# Patient Record
Sex: Female | Born: 1980 | Race: Black or African American | Hispanic: No | Marital: Married | Smoking: Never smoker
Health system: Southern US, Community
[De-identification: ages and names within clinical notes are randomized; demographics above are authoritative.]

## PROBLEM LIST (undated history)

## (undated) ENCOUNTER — Inpatient Hospital Stay (HOSPITAL_COMMUNITY): Payer: Self-pay

## (undated) DIAGNOSIS — O24419 Gestational diabetes mellitus in pregnancy, unspecified control: Secondary | ICD-10-CM

## (undated) DIAGNOSIS — E063 Autoimmune thyroiditis: Secondary | ICD-10-CM

## (undated) DIAGNOSIS — E039 Hypothyroidism, unspecified: Secondary | ICD-10-CM

## (undated) HISTORY — PX: UMBILICAL HERNIA REPAIR: SHX196

## (undated) HISTORY — PX: HERNIA REPAIR: SHX51

## (undated) HISTORY — DX: Gestational diabetes mellitus in pregnancy, unspecified control: O24.419

---

## 2003-10-28 ENCOUNTER — Inpatient Hospital Stay (HOSPITAL_COMMUNITY): Admission: AD | Admit: 2003-10-28 | Discharge: 2003-10-28 | Payer: Self-pay | Admitting: Obstetrics and Gynecology

## 2003-10-30 ENCOUNTER — Inpatient Hospital Stay (HOSPITAL_COMMUNITY): Admission: AD | Admit: 2003-10-30 | Discharge: 2003-10-30 | Payer: Self-pay | Admitting: Obstetrics and Gynecology

## 2003-10-30 ENCOUNTER — Other Ambulatory Visit: Admission: RE | Admit: 2003-10-30 | Discharge: 2003-10-30 | Payer: Self-pay | Admitting: Obstetrics and Gynecology

## 2004-09-19 ENCOUNTER — Other Ambulatory Visit: Admission: RE | Admit: 2004-09-19 | Discharge: 2004-09-19 | Payer: Self-pay | Admitting: Obstetrics & Gynecology

## 2005-09-23 ENCOUNTER — Other Ambulatory Visit: Admission: RE | Admit: 2005-09-23 | Discharge: 2005-09-23 | Payer: Self-pay | Admitting: Obstetrics & Gynecology

## 2006-08-04 ENCOUNTER — Emergency Department (HOSPITAL_COMMUNITY): Admission: EM | Admit: 2006-08-04 | Discharge: 2006-08-05 | Payer: Self-pay | Admitting: Emergency Medicine

## 2006-08-04 ENCOUNTER — Emergency Department (HOSPITAL_COMMUNITY): Admission: EM | Admit: 2006-08-04 | Discharge: 2006-08-04 | Payer: Self-pay | Admitting: Emergency Medicine

## 2006-10-12 ENCOUNTER — Inpatient Hospital Stay (HOSPITAL_COMMUNITY): Admission: AD | Admit: 2006-10-12 | Discharge: 2006-10-12 | Payer: Self-pay | Admitting: Obstetrics and Gynecology

## 2006-10-14 ENCOUNTER — Inpatient Hospital Stay (HOSPITAL_COMMUNITY): Admission: AD | Admit: 2006-10-14 | Discharge: 2006-10-14 | Payer: Self-pay | Admitting: Obstetrics and Gynecology

## 2006-10-16 ENCOUNTER — Inpatient Hospital Stay (HOSPITAL_COMMUNITY): Admission: AD | Admit: 2006-10-16 | Discharge: 2006-10-17 | Payer: Self-pay | Admitting: Obstetrics and Gynecology

## 2006-10-23 ENCOUNTER — Inpatient Hospital Stay (HOSPITAL_COMMUNITY): Admission: AD | Admit: 2006-10-23 | Discharge: 2006-10-23 | Payer: Self-pay | Admitting: Obstetrics and Gynecology

## 2006-10-28 ENCOUNTER — Inpatient Hospital Stay (HOSPITAL_COMMUNITY): Admission: AD | Admit: 2006-10-28 | Discharge: 2006-10-28 | Payer: Self-pay | Admitting: Obstetrics and Gynecology

## 2006-11-02 ENCOUNTER — Inpatient Hospital Stay (HOSPITAL_COMMUNITY): Admission: AD | Admit: 2006-11-02 | Discharge: 2006-11-02 | Payer: Self-pay | Admitting: Obstetrics and Gynecology

## 2006-11-06 ENCOUNTER — Inpatient Hospital Stay (HOSPITAL_COMMUNITY): Admission: AD | Admit: 2006-11-06 | Discharge: 2006-11-06 | Payer: Self-pay | Admitting: Obstetrics and Gynecology

## 2007-05-03 ENCOUNTER — Inpatient Hospital Stay (HOSPITAL_COMMUNITY): Admission: AD | Admit: 2007-05-03 | Discharge: 2007-05-04 | Payer: Self-pay | Admitting: Obstetrics and Gynecology

## 2007-06-07 ENCOUNTER — Inpatient Hospital Stay (HOSPITAL_COMMUNITY): Admission: AD | Admit: 2007-06-07 | Discharge: 2007-06-07 | Payer: Self-pay | Admitting: Obstetrics and Gynecology

## 2007-06-14 ENCOUNTER — Inpatient Hospital Stay (HOSPITAL_COMMUNITY): Admission: AD | Admit: 2007-06-14 | Discharge: 2007-06-17 | Payer: Self-pay | Admitting: Obstetrics and Gynecology

## 2007-06-14 ENCOUNTER — Encounter (INDEPENDENT_AMBULATORY_CARE_PROVIDER_SITE_OTHER): Payer: Self-pay | Admitting: Obstetrics & Gynecology

## 2007-06-22 ENCOUNTER — Inpatient Hospital Stay (HOSPITAL_COMMUNITY): Admission: AD | Admit: 2007-06-22 | Discharge: 2007-06-22 | Payer: Self-pay | Admitting: Obstetrics & Gynecology

## 2008-09-23 IMAGING — CR DG CHEST 2V
2 series · 2 of 2 positions shown · non-contrast
Comparison: none

CLINICAL DATA: Cough, nausea, vomiting. 
 CHEST - 2 VIEW:
 The heart size and mediastinal contours are within normal limits.  Both lungs are clear.  The visualized skeletal structures are unremarkable.

[w chest pa]
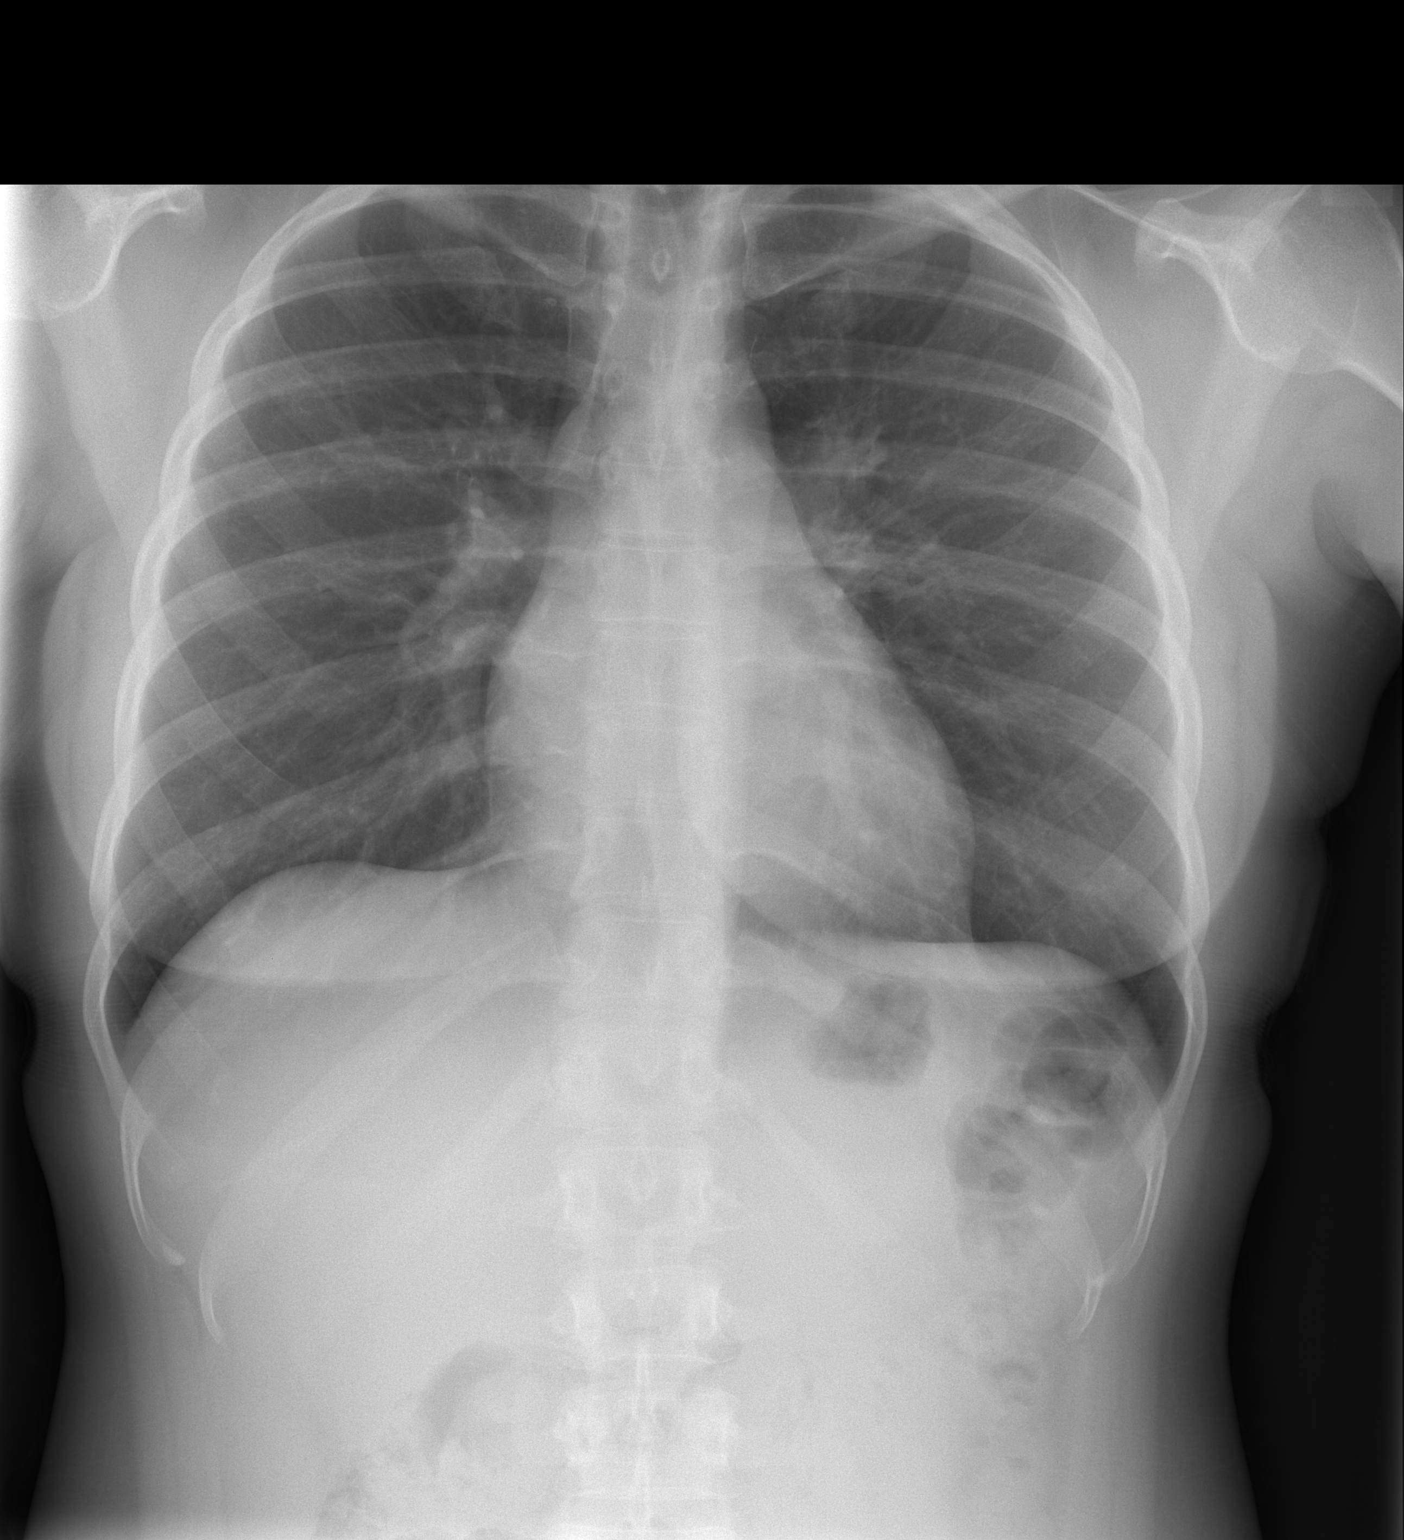

[w chest lat]
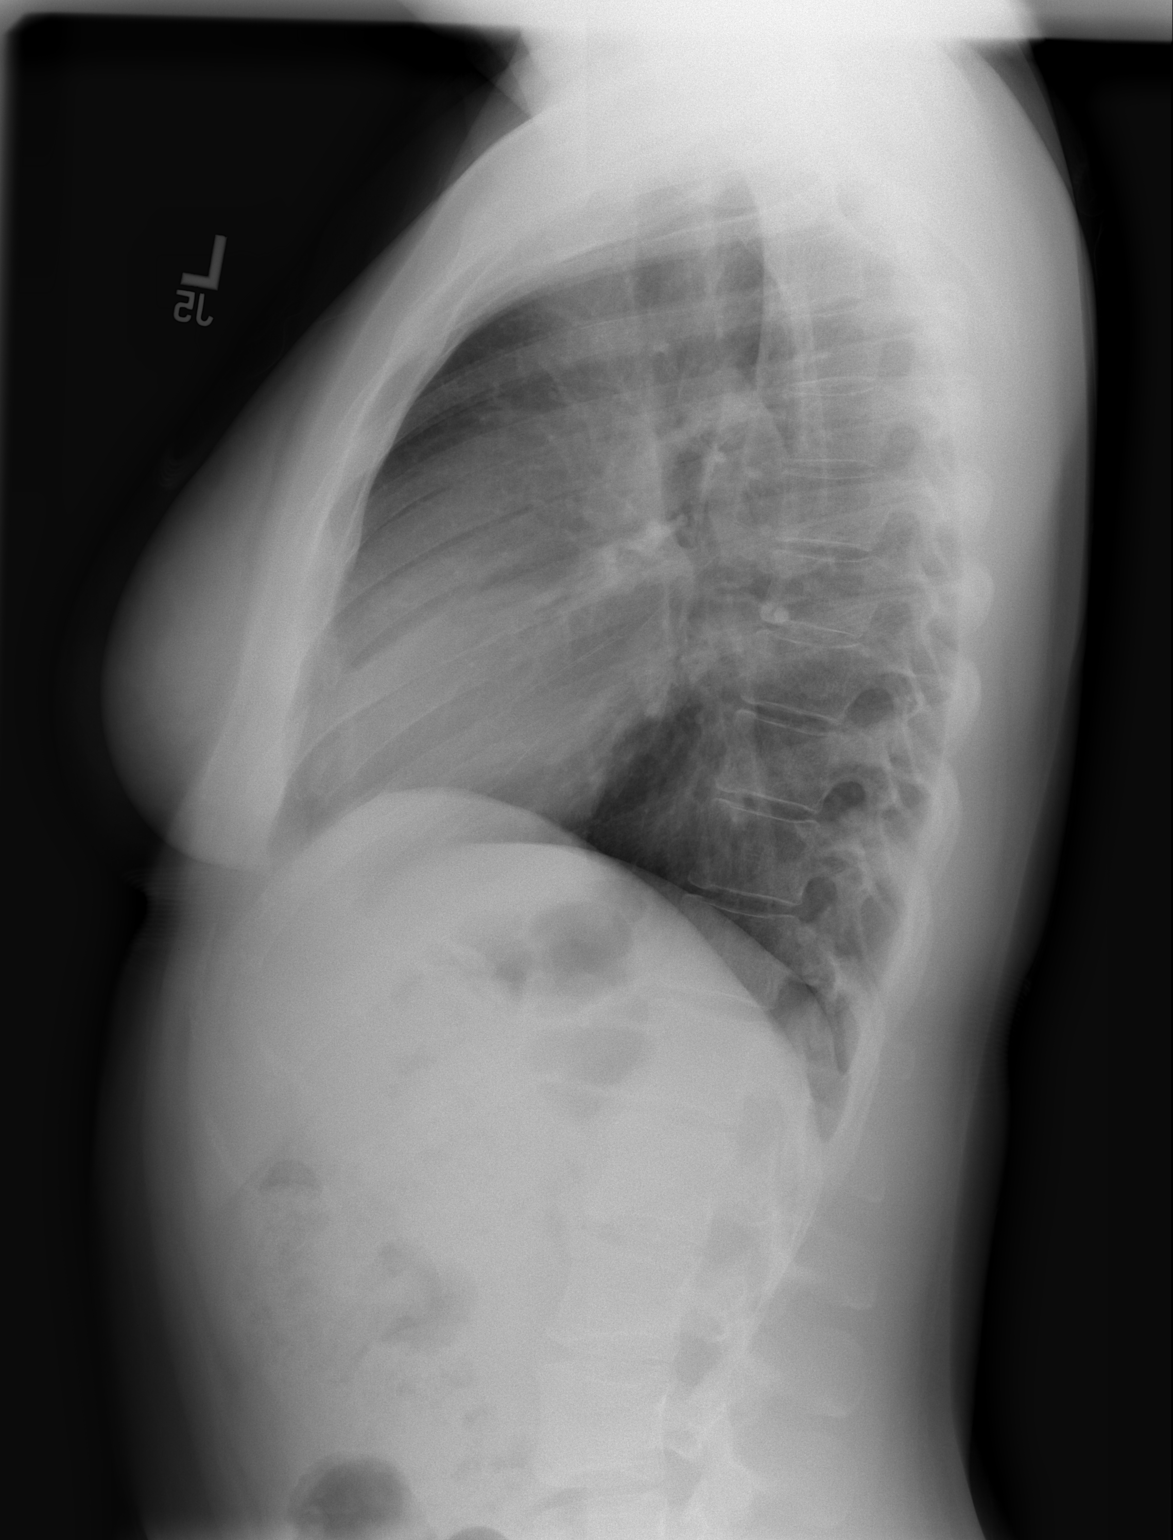

[2 of 2 positions shown; findings below may reference images not displayed]

IMPRESSION: No active lung disease.

## 2009-11-05 ENCOUNTER — Emergency Department (HOSPITAL_BASED_OUTPATIENT_CLINIC_OR_DEPARTMENT_OTHER): Admission: EM | Admit: 2009-11-05 | Discharge: 2009-11-05 | Payer: Self-pay | Admitting: Emergency Medicine

## 2010-01-27 ENCOUNTER — Encounter: Admission: RE | Admit: 2010-01-27 | Discharge: 2010-01-27 | Payer: Self-pay | Admitting: Family Medicine

## 2011-02-03 NOTE — Op Note (Signed)
NAMERAELEIGH, GUINN NO.:  [id number]   MEDICAL RECORD NO.:  [id number]          PATIENT TYPE:  INP   LOCATION:  9117                          FACILITY:  [facility]   PHYSICIAN:  Gerrit Friends. Aldona Bar, M.D.   DATE OF BIRTH:  07/08/1981   DATE OF PROCEDURE:  06/14/2007  DATE OF DISCHARGE:                               OPERATIVE REPORT   PREOPERATIVE DIAGNOSIS:  Term pregnancy, early labor, nonreassuring  fetal heart tracing, meconium stained amniotic fluid.   POSTOPERATIVE DIAGNOSIS:  Term pregnancy, early labor, nonreassuring  fetal heart tracing, meconium stained amniotic fluid, delivery of 7  pounds 10 ounces female infant, Apgars 1 and 9, arterial cord pH 7.34 and  thick particulate meconium.   PROCEDURE:  Primary low transverse cesarean section.   SURGEON:  Gerrit Friends. Aldona Bar, M.D.   ANESTHESIA:  Spinal.   HISTORY:  This 30 year old gravida 4, para 0 presented to maternity  admissions at approximately 5:30 a.m. on the morning of June 14, 2007 and shortly after being placed on the fetal monitor had a  deceleration of the fetal heart.  Subsequently biophysical profile was  done with findings of 6/8.  When I arrived, checked the patient at  approximately 7:45, having been called because of a second significant  deceleration, there was meconium stained amniotic fluid noted on the bed  consistent with recent rupture of membranes.  Evaluation of the patient  found her 1 cm dilated, cervix fairly posterior, and although the fetal  heart rate tracing at this time was reassuring, there had been  approximately 10 minutes earlier a deceleration into the 80s lasting  approximately 3-4 minutes.  Because of the two decelerations and the  early phase of labor associated with meconium stained amniotic fluid,  discussion was carried out with the patient and partner as to questions  about fetal well-being and therefore decision was made after my  recommendation to proceed with a  primary low transverse cesarean section  for delivery.   The patient was readied and transferred to the operating room for  cesarean section.   Once in the operating room, spinal anesthetic was carried out by Dr.  Arby Barrette.  After the patient was appropriate position and good fetal  heart tones were noted and the patient was thereafter prepped and draped  with a Foley catheter inserted as part of the prep.   At this time once good anesthetic levels were documented, a Pfannenstiel  incision was made with minimal difficulty dissected down sharply to and  through the fascia in a low transverse fashion with hemostasis created  at each layer.  Subfascial space was created inferiorly and superiorly,  muscles separated in midline with care taken to avoid the bowel  superiorly and the bladder inferiorly.  At this time with the peritoneum  opened, the vesicouterine peritoneum was incised in a low transverse  fashion and pushed off the lower uterine segment with ease.  Sharp  incision into the uterus in low transverse fashion was then carried out  with Metzenbaum scissors and extended laterally with fingers.  Particulate fresh green meconium stained amniotic fluid was noted.  Thereafter delivery of viable female infant was carried out from vertex  position and the infant was suctioned as best as possible prior to  delivering the remainder of the infant, clamping the cord, and passed in  the infant off to the awaiting team.   Cord arterial cord pH was collected and returned at 7.34.   Subsequent Apgars were noted to be 1 and 9 and the infant was also taken  to the regular nursery in good condition.   After the cord bloods were collected, placenta was delivered intact.  Membranes for meconium stained and placenta was sent to pathology  appropriately labeled.   At this time the uterus was exteriorized, rendered free of any remaining  products of conception and good uterine contractility was  afforded with  slowly given intravenous Pitocin and manual stimulation.  Closure of the  uterine incision was carried out with a single layer of #1 Vicryl in  running locking fashion with excellent hemostasis.  Tubes and ovaries  were noted to be normal.  After the abdomen was lavaged of all blood and  clot, the uterus was replaced into the abdominal cavity and after all  counts were noted be correct and no foreign bodies noted to be remaining  abdominal cavity, closure of the abdomen was begun in layers.  The  abdominal peritoneum was closed with zero Vicryl in running fashion.  Muscle secured with same.  Assured of good fascial hemostasis, fascia  was then reapproximated using 0 Vicryl from angle to midline  bilaterally.  Subcutaneous tissues were rendered hemostatic and staples  then used to close the skin.  A sterile pressure dressing was applied.  At this time the patient was transported to recovery room in  satisfactory condition having tolerated procedure well.  Estimated blood  loss was 500 mL.  All counts correct x2.   In summary this patient presented to maternity admissions with  nonreassuring fetal heart tracing and meconium stained amniotic fluid  and was in early labor at term.  Because of questions about fetal well-  being, decision was made to proceed with a primary low transverse  cesarean section which was carried out without difficulty.  The infant,  although the Apgar hours were 1 and 9 was ultimately taken to the  regular nursery in good condition.  The arterial cord pH was 7.34.  The  Apgar of 1 was probably explained by suctioning of the infant to remove  meconium.   At the conclusion of procedure, both mother and baby were doing well in  their respective recovery areas.  All counts correct.  Estimated loss  500 mL.      Gerrit Friends. Aldona Bar, M.D.  Electronically Signed     RMW/MEDQ  D:  06/14/2007  T:  06/14/2007  Job:  [id number]

## 2011-02-06 NOTE — Discharge Summary (Signed)
Brenda Welch, HIPP NO.:  [id number]   MEDICAL RECORD NO.:  [id number]          PATIENT TYPE:  INP   LOCATION:  9117                          FACILITY:  [facility]   PHYSICIAN:  Ilda Mori, M.D.   DATE OF BIRTH:  08/28/1981   DATE OF ADMISSION:  06/14/2007  DATE OF DISCHARGE:  06/17/2007                               DISCHARGE SUMMARY   FINAL DIAGNOSES:  1. Intrauterine pregnancy at term.  2. Early active labor.  3. Non-reassuring fetal heart tracing.  4. Meconium-stained amniotic fluid with thick articular meconium noted      with delivery.   PROCEDURE:  Primary low transverse cesarean section.   SURGEON:  Dr. Annamaria Helling.   COMPLICATIONS:  None.   This 30 year old G4, P0-0-3-0, presents at 4 weeks' gestation to the  [facility]  She was in early labor.  She was placed on the fetal  monitor and immediately a deceleration of the fetal heart was noted.  The patient had a BPP performed which was 6/8.  Dr. Aldona Bar at this point  arrived to check the patient by 7:45, and was called because of this  thick and significant deceleration.  Meconium-stained amniotic fluid was  noted with recent rupture of membranes.  The patient was only about a  centimeter dilated and still very posterior.  The fetal heart rate  tracing at this time was reassuring, even though there had been a  deceleration down to the 80s prior. Because of the 2 decelerations in  the early phase of labor and meconium-stained amniotic fluid, a decision  was held with the patient and her partner, and a decision was made to  proceed with a cesarean section secondary to these non-reassuring fetal  heart tracings.  The patient was taken to the operating room on  June 14, 2007 by Dr. Annamaria Helling, where a primary low transverse  cesarean section was performed with the delivery of a 7-pound 10-ounce  female infant with Apgar's of 1 and 9.  An arterial cord pH was obtained  of 7.34, and thick  particulate meconium matter was noted with delivery.  Ultimately, the baby was taken to the regular nursery in good condition.  The procedure went without complications.  The patient's postoperative  course was benign without any significant fevers.  She did want her  little boy circumcised before discharge, which was performed, and was  felt ready for discharge on postoperative day #3.  Was sent home on a  regular diet.  Told to decrease activities.  Told to continue her  prenatal vitamins.  Was given Tylox #15, 1-2 every 4 hours as needed for  pain.  Told she could use  ibuprofen up to 600 mg every 6 hours as  needed for pain, and was to follow up in our office in 4 weeks.  Instructions and precautions were reviewed with the patient.   LABORATORY DATA ON DISCHARGE:  She had a hemoglobin of 9.4, white blood  cell count of 11.9 and platelets of 159,000.      Leilani Able, P.A.-C.      Ilda Mori, M.D.  Electronically Signed  MB/MEDQ  D:  07/22/2007  T:  07/23/2007  Job:  [id number]

## 2011-06-30 ENCOUNTER — Other Ambulatory Visit: Payer: Self-pay | Admitting: Obstetrics & Gynecology

## 2011-07-02 LAB — CBC
Hemoglobin: 11.5 — ABNORMAL LOW
MCHC: 34
MCHC: 34.3
MCV: 88.3
Platelets: 210
RBC: 3.1 — ABNORMAL LOW
RBC: 3.81 — ABNORMAL LOW
RDW: 14.7 — ABNORMAL HIGH
WBC: 11 — ABNORMAL HIGH

## 2011-07-06 LAB — URINALYSIS, ROUTINE W REFLEX MICROSCOPIC
Bilirubin Urine: NEGATIVE
Glucose, UA: >1000 — AB
Hgb urine dipstick: NEGATIVE
Nitrite: NEGATIVE
Protein, ur: NEGATIVE
pH: 6

## 2011-07-06 LAB — URINE MICROSCOPIC-ADD ON: WBC, UA: NONE SEEN

## 2013-02-18 ENCOUNTER — Encounter (HOSPITAL_COMMUNITY): Payer: Self-pay | Admitting: *Deleted

## 2013-02-18 ENCOUNTER — Inpatient Hospital Stay (HOSPITAL_COMMUNITY)
Admission: AD | Admit: 2013-02-18 | Discharge: 2013-02-18 | Disposition: A | Discharge status: Home / Self Care | Payer: 59 | Source: Ambulatory Visit | Attending: Obstetrics and Gynecology | Admitting: Obstetrics and Gynecology

## 2013-02-18 DIAGNOSIS — E860 Dehydration: Secondary | ICD-10-CM | POA: Insufficient documentation

## 2013-02-18 DIAGNOSIS — O219 Vomiting of pregnancy, unspecified: Secondary | ICD-10-CM

## 2013-02-18 DIAGNOSIS — O210 Mild hyperemesis gravidarum: Secondary | ICD-10-CM

## 2013-02-18 DIAGNOSIS — O211 Hyperemesis gravidarum with metabolic disturbance: Secondary | ICD-10-CM | POA: Insufficient documentation

## 2013-02-18 LAB — URINALYSIS, ROUTINE W REFLEX MICROSCOPIC
Hgb urine dipstick: NEGATIVE
Ketones, ur: 15 mg/dL — AB
Protein, ur: NEGATIVE mg/dL
Urobilinogen, UA: 0.2 mg/dL (ref 0.0–1.0)
pH: 6 (ref 5.0–8.0)

## 2013-02-18 MED ORDER — ONDANSETRON 4 MG PO TBDP: 4.0000 mg | ORAL_TABLET | Freq: Four times a day (QID) | ORAL | Status: DC | PRN

## 2013-02-18 MED ORDER — LACTATED RINGERS IV SOLN
INTRAVENOUS | Status: DC
  Administered 2013-02-18: 17:00:00 via INTRAVENOUS

## 2013-02-18 MED ORDER — PROMETHAZINE HCL 25 MG/ML IJ SOLN
25.0000 mg | Freq: Once | INTRAVENOUS | Status: AC
  Administered 2013-02-18: 25 mg via INTRAVENOUS
  Filled 2013-02-18: qty 1

## 2013-02-18 NOTE — MAU Note (Signed)
Pt reports having N/V unable to keep anything down since yesterday. Has not voided since 1am has no urge to void.

## 2013-02-18 NOTE — MAU Note (Signed)
Patient presents to MAU with c/o emesis x 8 occurences within last 24 hours. Denies vaginal bleeding, LOF, or cramping.  Reports Rx for phenergan and zofran; s/s unrelieved. Hx of hyperemesis with first pregnancy also.

## 2013-02-18 NOTE — MAU Provider Note (Signed)
Chief Complaint: Morning Sickness and Emesis During Pregnancy   First Provider Initiated Contact with Patient 02/18/13 1542     SUBJECTIVE HPI: Jasmain Ahlberg is a 32 y.o. G2P1001 at [redacted]w[redacted]d by LMP who presents with N/V refractory to tx with Phenergan suppositories. Retained no food or fluids for >24 hrs. Last void 0100 (14 hrs ago). No other concerns.   History reviewed. No pertinent past medical history. OB History   Grav Para Term Preterm Abortions TAB SAB Ect Mult Living   2 1 1       1      # Outc Date GA Lbr Len/2nd Wgt Sex Del Anes PTL Lv   1 TRM 2008 [redacted]w[redacted]d   M CS  No Yes   Comments: meconium-stained fluid    2 CUR              Past Surgical History  Procedure Laterality Date  . Umbilical hernia repair    . Cesarean section     History   Social History  . Marital Status: Married    Spouse Name: N/A    Number of Children: N/A  . Years of Education: N/A   Occupational History  . Not on file.   Social History Main Topics  . Smoking status: Never Smoker   . Smokeless tobacco: Never Used  . Alcohol Use: No  . Drug Use: No  . Sexually Active: Yes    Birth Control/ Protection: None   Other Topics Concern  . Not on file   Social History Narrative  . No narrative on file   No current facility-administered medications on file prior to encounter.   No current outpatient prescriptions on file prior to encounter.   No Known Allergies  ROS: Pertinent items in HPI  OBJECTIVE Blood pressure 118/87, pulse 78, temperature 99.1 F (37.3 C), temperature source Oral, resp. rate 18, height 5\' 1"  (1.549 m), weight 70.398 kg (155 lb 3.2 oz), last menstrual period 12/23/2012. GENERAL: Well-developed, well-nourished female in no acute distress.  HEENT: Normocephalic HEART: normal rate RESP: normal effort ABDOMEN: Soft, non-tender EXTREMITIES: Nontender, no edema NEURO: Alert and oriented   LAB RESULTS Results for orders placed during the hospital encounter of 02/18/13  (from the past 24 hour(s))  URINALYSIS, ROUTINE W REFLEX MICROSCOPIC     Status: Abnormal   Collection Time    02/18/13  6:23 PM      Result Value Range   Color, Urine YELLOW  YELLOW   APPearance CLEAR  CLEAR   Specific Gravity, Urine 1.010  1.005 - 1.030   pH 6.0  5.0 - 8.0   Glucose, UA NEGATIVE  NEGATIVE mg/dL   Hgb urine dipstick NEGATIVE  NEGATIVE   Bilirubin Urine NEGATIVE  NEGATIVE   Ketones, ur 15 (*) NEGATIVE mg/dL   Protein, ur NEGATIVE  NEGATIVE mg/dL   Urobilinogen, UA 0.2  0.0 - 1.0 mg/dL   Nitrite NEGATIVE  NEGATIVE   Leukocytes, UA NEGATIVE  NEGATIVE   Urine specimen not obtained until on second bag of fluids   MAU COURSE IV LR 1000 with Phenergan 25mg ; IV #2 LR 1900: feeeling better and tolerating juice and crackers  ASSESSMENT 1. Nausea and vomiting in pregnancy prior to [redacted] weeks gestation   G2P1001 at [redacted]w[redacted]d, mild dehydration  PLAN Discharge home See AVS   Medication List    TAKE these medications       acetaminophen 500 MG tablet  Commonly known as:  TYLENOL  Take 500 mg by  mouth every 6 (six) hours as needed for pain.     FAMOTIDINE PO  Take 1 tablet by mouth daily as needed (heartburn).     ondansetron 4 MG disintegrating tablet  Commonly known as:  ZOFRAN ODT  Take 1 tablet (4 mg total) by mouth every 6 (six) hours as needed for nausea.     ondansetron 8 MG tablet  Commonly known as:  ZOFRAN  Take 8 mg by mouth every 8 (eight) hours as needed for nausea.     promethazine 25 MG suppository  Commonly known as:  PHENERGAN  Place 25 mg rectally every 4 (four) hours as needed for nausea.       Follow-up Information   Follow up with Levi Aland, MD. (Keep your scheduled appointment)    Contact information:   [address] [place] [address] [phone]         Danae Orleans, CNM 02/18/2013  3:43 PM

## 2013-03-15 LAB — OB RESULTS CONSOLE RUBELLA ANTIBODY, IGM: Rubella: IMMUNE

## 2013-03-15 LAB — OB RESULTS CONSOLE ABO/RH: RH Type: POSITIVE

## 2013-03-15 LAB — OB RESULTS CONSOLE HEPATITIS B SURFACE ANTIGEN: Hepatitis B Surface Ag: NEGATIVE

## 2013-03-15 LAB — OB RESULTS CONSOLE HIV ANTIBODY (ROUTINE TESTING): HIV: NONREACTIVE

## 2013-03-15 LAB — OB RESULTS CONSOLE ANTIBODY SCREEN: Antibody Screen: NEGATIVE

## 2013-03-15 LAB — OB RESULTS CONSOLE RPR: RPR: NONREACTIVE

## 2013-07-12 ENCOUNTER — Encounter: Payer: 59 | Attending: Obstetrics and Gynecology

## 2013-07-12 VITALS — Ht 61.0 in | Wt 185.3 lb

## 2013-07-12 DIAGNOSIS — O99810 Abnormal glucose complicating pregnancy: Secondary | ICD-10-CM | POA: Insufficient documentation

## 2013-07-12 DIAGNOSIS — Z713 Dietary counseling and surveillance: Secondary | ICD-10-CM | POA: Insufficient documentation

## 2013-07-13 NOTE — Progress Notes (Signed)
  Patient was seen on 07/12/13 for Gestational Diabetes self-management class at the [facility]. The following learning objectives were met by the patient during this course:   States the definition of Gestational Diabetes  States why dietary management is important in controlling blood glucose  Describes the effects of carbohydrates on blood glucose levels  Demonstrates ability to create a balanced meal plan  Demonstrates carbohydrate counting   States when to check blood glucose levels  Demonstrates proper blood glucose monitoring techniques  States the effect of stress and exercise on blood glucose levels  States the importance of limiting caffeine and abstaining from alcohol and smoking  Plan:  Aim for 2 Carb Choices per meal (30 grams) +/- 1 either way for breakfast Aim for 3 Carb Choices per meal (45 grams) +/- 1 either way from lunch and dinner Aim for 1-2 Carbs per snack Begin reading food labels for Total Carbohydrate and sugar grams of foods Consider  increasing your activity level by walking daily as tolerated Begin checking BG before breakfast and 1-2 hours after first bit of breakfast, lunch and dinner after  as directed by MD  Take medication  as directed by MD  Blood glucose monitor given: Accu Chek Nano BG Monitoring Kit Lot # [id number] Exp: 08/20/14 Blood glucose reading: 111  Patient instructed to monitor glucose levels: FBS: 60 - <90 1 hour: <140 2 hour: <120  Patient received the following handouts:  Nutrition Diabetes and Pregnancy  Carbohydrate Counting List  Meal Planning worksheet  Patient will be seen for follow-up as needed.

## 2013-07-15 ENCOUNTER — Inpatient Hospital Stay (HOSPITAL_COMMUNITY)
Admission: AD | Admit: 2013-07-15 | Discharge: 2013-07-15 | Disposition: A | Discharge status: Home / Self Care | Payer: 59 | Source: Ambulatory Visit | Attending: Obstetrics & Gynecology | Admitting: Obstetrics & Gynecology

## 2013-07-15 ENCOUNTER — Encounter (HOSPITAL_COMMUNITY): Payer: Self-pay | Admitting: Family

## 2013-07-15 DIAGNOSIS — T2122XA Burn of second degree of abdominal wall, initial encounter: Secondary | ICD-10-CM | POA: Insufficient documentation

## 2013-07-15 DIAGNOSIS — X118XXA Contact with other hot tap-water, initial encounter: Secondary | ICD-10-CM | POA: Insufficient documentation

## 2013-07-15 DIAGNOSIS — O479 False labor, unspecified: Secondary | ICD-10-CM

## 2013-07-15 DIAGNOSIS — O99891 Other specified diseases and conditions complicating pregnancy: Secondary | ICD-10-CM

## 2013-07-15 DIAGNOSIS — Y92009 Unspecified place in unspecified non-institutional (private) residence as the place of occurrence of the external cause: Secondary | ICD-10-CM | POA: Insufficient documentation

## 2013-07-15 MED ORDER — DOUBLE ANTIBIOTIC 500-10000 UNIT/GM EX OINT: 1.0000 "application " | TOPICAL_OINTMENT | Freq: Two times a day (BID) | CUTANEOUS | Status: DC

## 2013-07-15 NOTE — MAU Note (Signed)
Pt presents with complaints of spilling hot water on her abdomen this morning.

## 2013-07-15 NOTE — MAU Provider Note (Signed)
History     CSN: [id number]  Arrival date and time: 07/15/13 1008   First Provider Initiated Contact with Patient 07/15/13 1058      Chief Complaint  Patient presents with  . Burn   HPI  Ms. Brenda Welch is a 32 y.o. female G2P1001 at [redacted]w[redacted]d who presents following an episode where she spilled scalding hot water on her abdomen while making oatmeal for her son. Shortly after the hot water touched her abdomen the patient felt contractions. This is not a new finding, patient has been experiencing inconsistent contractions for a few weeks. The contractions are not painful, she feels the tightening of her abdomen. Immediately following the burn, the patient placed a cool washcloth on her abdomen and placed some ointment on the burn area. Pt believes she had a tetnus shot within 10 years.   OB History   Grav Para Term Preterm Abortions TAB SAB Ect Mult Living   2 1 1       1       Past Medical History  Diagnosis Date  . Gestational diabetes     Past Surgical History  Procedure Laterality Date  . Umbilical hernia repair    . Cesarean section      History reviewed. No pertinent family history.  History  Substance Use Topics  . Smoking status: Never Smoker   . Smokeless tobacco: Never Used  . Alcohol Use: No    Allergies: No Known Allergies  Prescriptions prior to admission  Medication Sig Dispense Refill  . acetaminophen (TYLENOL) 500 MG tablet Take 500 mg by mouth every 6 (six) hours as needed for pain.      Marland Kitchen FAMOTIDINE PO Take 1 tablet by mouth daily as needed (heartburn).      . flintstones complete (FLINTSTONES) 60 MG chewable tablet Chew 1 tablet by mouth daily.        Review of Systems  Constitutional: Negative for fever and chills.  Respiratory: Negative for shortness of breath.   Cardiovascular: Negative for chest pain.  Gastrointestinal: Positive for abdominal pain. Negative for nausea and vomiting.       +tightening in the abdomen   Musculoskeletal:  Negative for back pain.   Physical Exam   Blood pressure 124/70, pulse 85, temperature 98.8 F (37.1 C), temperature source Oral, resp. rate 16, last menstrual period 12/23/2012.  Physical Exam  Constitutional: She is oriented to person, place, and time. She appears well-developed and well-nourished. No distress.  Neck: Neck supple.  Respiratory: Effort normal.  GI: There is tenderness in the right upper quadrant and epigastric area.    Second degree burn; 2 inch area in the mid-right upper quadrant abdomen with clean, intact blisters. Superificial, partial -thickness burn.  Surrounding skin with erythema, intact skin; warm to touch.   Neurological: She is alert and oriented to person, place, and time.  Skin: Skin is warm. She is not diaphoretic.   Fetal Tracing: Baseline: 145 bpm Variability: Moderate-marked  Accelerations: 15x15 Decelerations: none  Toco: None, no contractions noted   MAU Course  Procedures None  MDM NST  Consulted with Dr. Arlyce Dice; ok to send patient home and have her follow up in the office.   Assessment and Plan  A: 1. Second degree burn of abdomen, initial encounter    P: Discharge home Keep the skin cool and moist RX: Polysporin cream if needed.  Keep your appointment with Dr. Arlyce Dice on Tuesday Ok to take tylenol as needed; as directed on the bottle.  Matalie Romberger IRENE FNP-C  07/15/2013, 1:01 PM

## 2013-07-15 NOTE — MAU Note (Addendum)
32 yo, G2P1 at [redacted]w[redacted]d, presents to MAU with s/p spilling hot water over abdomen today at 0845. Reports she was making oatmeal and dropped scalding water onto abdomen. Reports she put antibiotic ointment on affected area and covered with towel. Patient denies VB, LOF. Reports +FM. Reports she had mild contractions about 10 minutes apart after the incident. 1000mg  Tylenol at 0930.

## 2013-09-18 ENCOUNTER — Encounter (HOSPITAL_COMMUNITY): Payer: Self-pay | Admitting: Pharmacist

## 2013-09-22 ENCOUNTER — Encounter (HOSPITAL_COMMUNITY)
Admission: RE | Admit: 2013-09-22 | Discharge: 2013-09-22 | Disposition: A | Discharge status: Home / Self Care | Payer: 59 | Source: Ambulatory Visit | Attending: Obstetrics and Gynecology | Admitting: Obstetrics and Gynecology

## 2013-09-22 ENCOUNTER — Encounter (HOSPITAL_COMMUNITY): Payer: Self-pay

## 2013-09-22 DIAGNOSIS — Z01812 Encounter for preprocedural laboratory examination: Secondary | ICD-10-CM | POA: Insufficient documentation

## 2013-09-22 DIAGNOSIS — Z01818 Encounter for other preprocedural examination: Secondary | ICD-10-CM | POA: Insufficient documentation

## 2013-09-22 LAB — CBC
HCT: 30 % — ABNORMAL LOW (ref 36.0–46.0)
HEMOGLOBIN: 9.8 g/dL — AB (ref 12.0–15.0)
MCH: 26.5 pg (ref 26.0–34.0)
MCHC: 32.7 g/dL (ref 30.0–36.0)
MCV: 81.1 fL (ref 78.0–100.0)
Platelets: 237 10*3/uL (ref 150–400)
RBC: 3.7 MIL/uL — AB (ref 3.87–5.11)
RDW: 15.6 % — ABNORMAL HIGH (ref 11.5–15.5)
WBC: 7.9 10*3/uL (ref 4.0–10.5)

## 2013-09-22 LAB — TYPE AND SCREEN
ABO/RH(D): O POS
ANTIBODY SCREEN: NEGATIVE

## 2013-09-22 LAB — BASIC METABOLIC PANEL
BUN: 10 mg/dL (ref 6–23)
CO2: 18 meq/L — AB (ref 19–32)
CREATININE: 0.65 mg/dL (ref 0.50–1.10)
Calcium: 8.8 mg/dL (ref 8.4–10.5)
Chloride: 101 mEq/L (ref 96–112)
GFR calc Af Amer: >90 mL/min (ref >90)
GFR calc non Af Amer: >90 mL/min (ref >90)
GLUCOSE: 144 mg/dL — AB (ref 70–99)
Potassium: 3.9 mEq/L (ref 3.7–5.3)
Sodium: 135 mEq/L — ABNORMAL LOW (ref 137–147)

## 2013-09-22 LAB — RPR: RPR Ser Ql: NONREACTIVE

## 2013-09-22 NOTE — Patient Instructions (Signed)
20 Brenda Welch  09/22/2013   Your procedure is scheduled on:  09/26/13  Enter through the Main Entrance of [facility] at 11 AM.  Pick up the phone at the desk and dial [phone].   Call this number if you have problems the morning of surgery: [phone]   Remember:   Do not eat food:After Midnight.  Do not drink clear liquids: 4 Hours before arrival.  Take these medicines the morning of surgery with A SIP OF WATER: NA   Do not wear jewelry, make-up or nail polish.  Do not wear lotions, powders, or perfumes. You may wear deodorant.  Do not shave 48 hours prior to surgery.  Do not bring valuables to the hospital.  [facility] is not   responsible for any belongings or valuables brought to the hospital.  Contacts, dentures or bridgework may not be worn into surgery.  Leave suitcase in the car. After surgery it may be brought to your room.  For patients admitted to the hospital, checkout time is 11:00 AM the day of              discharge.   Patients discharged the day of surgery will not be allowed to drive             home.  Name and phone number of your driver: NA  Special Instructions:   Shower using CHG 2 nights before surgery and the night before surgery.  If you shower the day of surgery use CHG.  Use special wash - you have one bottle of CHG for all showers.  You should use approximately 1/3 of the bottle for each shower.   Please read over the following fact sheets that you were given:   Surgical Site Infection Prevention

## 2013-09-26 ENCOUNTER — Inpatient Hospital Stay (HOSPITAL_COMMUNITY)
Admission: RE | Admit: 2013-09-26 | Discharge: 2013-09-29 | DRG: 766 | Disposition: A | Discharge status: Home / Self Care | Payer: 59 | Source: Ambulatory Visit | Attending: Obstetrics and Gynecology | Admitting: Obstetrics and Gynecology

## 2013-09-26 ENCOUNTER — Encounter (HOSPITAL_COMMUNITY)
Admission: RE | Disposition: A | Discharge status: Home / Self Care | Payer: Self-pay | Source: Ambulatory Visit | Attending: Obstetrics and Gynecology

## 2013-09-26 ENCOUNTER — Inpatient Hospital Stay (HOSPITAL_COMMUNITY): Payer: 59 | Admitting: Anesthesiology

## 2013-09-26 ENCOUNTER — Encounter (HOSPITAL_COMMUNITY): Payer: Self-pay | Admitting: *Deleted

## 2013-09-26 ENCOUNTER — Encounter (HOSPITAL_COMMUNITY): Payer: 59 | Admitting: Anesthesiology

## 2013-09-26 DIAGNOSIS — O34219 Maternal care for unspecified type scar from previous cesarean delivery: Principal | ICD-10-CM | POA: Diagnosis present

## 2013-09-26 DIAGNOSIS — O99814 Abnormal glucose complicating childbirth: Secondary | ICD-10-CM | POA: Diagnosis present

## 2013-09-26 DIAGNOSIS — Z794 Long term (current) use of insulin: Secondary | ICD-10-CM

## 2013-09-26 LAB — GLUCOSE, CAPILLARY
Glucose-Capillary: 88 mg/dL (ref 70–99)
Glucose-Capillary: 84 mg/dL (ref 70–99)

## 2013-09-26 LAB — TYPE AND SCREEN
ABO/RH(D): O POS
Antibody Screen: NEGATIVE

## 2013-09-26 SURGERY — Surgical Case
Anesthesia: Spinal

## 2013-09-26 MED ORDER — OXYTOCIN 10 UNIT/ML IJ SOLN
INTRAMUSCULAR | Status: AC
  Filled 2013-09-26: qty 4

## 2013-09-26 MED ORDER — SIMETHICONE 80 MG PO CHEW
80.0000 mg | CHEWABLE_TABLET | ORAL | Status: DC | PRN
Start: 2013-09-26 — End: 2013-09-29

## 2013-09-26 MED ORDER — DIPHENHYDRAMINE HCL 25 MG PO CAPS: 25.0000 mg | ORAL_CAPSULE | ORAL | Status: DC | PRN

## 2013-09-26 MED ORDER — ZOLPIDEM TARTRATE 5 MG PO TABS: 5.0000 mg | ORAL_TABLET | Freq: Every evening | ORAL | Status: DC | PRN

## 2013-09-26 MED ORDER — MEPERIDINE HCL 25 MG/ML IJ SOLN: 6.2500 mg | INTRAMUSCULAR | Status: DC | PRN

## 2013-09-26 MED ORDER — MORPHINE SULFATE (PF) 0.5 MG/ML IJ SOLN
INTRAMUSCULAR | Status: DC | PRN
  Administered 2013-09-26: .2 mg via INTRATHECAL

## 2013-09-26 MED ORDER — SCOPOLAMINE 1 MG/3DAYS TD PT72
MEDICATED_PATCH | TRANSDERMAL | Status: AC
  Filled 2013-09-26: qty 1

## 2013-09-26 MED ORDER — SODIUM CHLORIDE 0.9 % IJ SOLN: 3.0000 mL | INTRAMUSCULAR | Status: DC | PRN

## 2013-09-26 MED ORDER — DIPHENHYDRAMINE HCL 25 MG PO CAPS: 25.0000 mg | ORAL_CAPSULE | Freq: Four times a day (QID) | ORAL | Status: DC | PRN

## 2013-09-26 MED ORDER — PHENYLEPHRINE 8 MG IN D5W 100 ML (0.08MG/ML) PREMIX OPTIME
INJECTION | INTRAVENOUS | Status: AC
  Filled 2013-09-26: qty 100

## 2013-09-26 MED ORDER — METOCLOPRAMIDE HCL 5 MG/ML IJ SOLN: 10.0000 mg | Freq: Three times a day (TID) | INTRAMUSCULAR | Status: DC | PRN

## 2013-09-26 MED ORDER — FENTANYL CITRATE 0.05 MG/ML IJ SOLN
INTRAMUSCULAR | Status: AC
  Filled 2013-09-26: qty 2

## 2013-09-26 MED ORDER — FENTANYL CITRATE 0.05 MG/ML IJ SOLN: 25.0000 ug | INTRAMUSCULAR | Status: DC | PRN

## 2013-09-26 MED ORDER — ONDANSETRON HCL 4 MG/2ML IJ SOLN: 4.0000 mg | Freq: Three times a day (TID) | INTRAMUSCULAR | Status: DC | PRN

## 2013-09-26 MED ORDER — FENTANYL CITRATE 0.05 MG/ML IJ SOLN
INTRAMUSCULAR | Status: DC | PRN
  Administered 2013-09-26: 12.5 ug via INTRATHECAL

## 2013-09-26 MED ORDER — DIPHENHYDRAMINE HCL 50 MG/ML IJ SOLN: 12.5000 mg | INTRAMUSCULAR | Status: DC | PRN

## 2013-09-26 MED ORDER — DIPHENHYDRAMINE HCL 50 MG/ML IJ SOLN: 25.0000 mg | INTRAMUSCULAR | Status: DC | PRN

## 2013-09-26 MED ORDER — NALOXONE HCL 0.4 MG/ML IJ SOLN: 0.4000 mg | INTRAMUSCULAR | Status: DC | PRN

## 2013-09-26 MED ORDER — SCOPOLAMINE 1 MG/3DAYS TD PT72
1.0000 | MEDICATED_PATCH | Freq: Once | TRANSDERMAL | Status: DC
  Filled 2013-09-26: qty 1

## 2013-09-26 MED ORDER — TETANUS-DIPHTH-ACELL PERTUSSIS 5-2.5-18.5 LF-MCG/0.5 IM SUSP
0.5000 mL | Freq: Once | INTRAMUSCULAR | Status: AC
  Administered 2013-09-27: 0.5 mL via INTRAMUSCULAR
  Filled 2013-09-26: qty 0.5

## 2013-09-26 MED ORDER — LANOLIN HYDROUS EX OINT: 1.0000 "application " | TOPICAL_OINTMENT | CUTANEOUS | Status: DC | PRN

## 2013-09-26 MED ORDER — ONDANSETRON HCL 4 MG/2ML IJ SOLN
INTRAMUSCULAR | Status: DC | PRN
  Administered 2013-09-26: 4 mg via INTRAVENOUS

## 2013-09-26 MED ORDER — PRENATAL MULTIVITAMIN CH
1.0000 | ORAL_TABLET | Freq: Every day | ORAL | Status: DC
  Administered 2013-09-27 – 2013-09-29 (×3): 1 via ORAL
  Filled 2013-09-26 (×3): qty 1

## 2013-09-26 MED ORDER — CEFAZOLIN SODIUM-DEXTROSE 2-3 GM-% IV SOLR
INTRAVENOUS | Status: AC
  Filled 2013-09-26: qty 50

## 2013-09-26 MED ORDER — OXYCODONE-ACETAMINOPHEN 5-325 MG PO TABS
1.0000 | ORAL_TABLET | ORAL | Status: DC | PRN
  Administered 2013-09-27 (×2): 1 via ORAL
  Administered 2013-09-27 (×2): 2 via ORAL
  Administered 2013-09-27: 1 via ORAL
  Administered 2013-09-28 – 2013-09-29 (×4): 2 via ORAL
  Filled 2013-09-26: qty 2
  Filled 2013-09-26: qty 1
  Filled 2013-09-26 (×2): qty 2
  Filled 2013-09-26: qty 1
  Filled 2013-09-26 (×3): qty 2
  Filled 2013-09-26: qty 1

## 2013-09-26 MED ORDER — LACTATED RINGERS IV SOLN
INTRAVENOUS | Status: DC
  Administered 2013-09-26 (×2): via INTRAVENOUS

## 2013-09-26 MED ORDER — CEFAZOLIN SODIUM-DEXTROSE 2-3 GM-% IV SOLR
INTRAVENOUS | Status: DC | PRN
  Administered 2013-09-26: 2 g via INTRAVENOUS

## 2013-09-26 MED ORDER — SCOPOLAMINE 1 MG/3DAYS TD PT72
1.0000 | MEDICATED_PATCH | Freq: Once | TRANSDERMAL | Status: DC
  Administered 2013-09-26: 1.5 mg via TRANSDERMAL

## 2013-09-26 MED ORDER — IBUPROFEN 600 MG PO TABS
600.0000 mg | ORAL_TABLET | Freq: Four times a day (QID) | ORAL | Status: DC | PRN
  Administered 2013-09-26 – 2013-09-27 (×2): 600 mg via ORAL

## 2013-09-26 MED ORDER — PHENYLEPHRINE 40 MCG/ML (10ML) SYRINGE FOR IV PUSH (FOR BLOOD PRESSURE SUPPORT)
PREFILLED_SYRINGE | INTRAVENOUS | Status: AC
  Filled 2013-09-26: qty 5

## 2013-09-26 MED ORDER — SIMETHICONE 80 MG PO CHEW
80.0000 mg | CHEWABLE_TABLET | ORAL | Status: DC
  Administered 2013-09-27 – 2013-09-28 (×3): 80 mg via ORAL
  Filled 2013-09-26 (×3): qty 1

## 2013-09-26 MED ORDER — OXYTOCIN 10 UNIT/ML IJ SOLN
40.0000 [IU] | INTRAVENOUS | Status: DC | PRN
  Administered 2013-09-26: 40 [IU] via INTRAVENOUS

## 2013-09-26 MED ORDER — SIMETHICONE 80 MG PO CHEW
80.0000 mg | CHEWABLE_TABLET | Freq: Three times a day (TID) | ORAL | Status: DC
  Administered 2013-09-26 – 2013-09-29 (×8): 80 mg via ORAL
  Filled 2013-09-26 (×9): qty 1

## 2013-09-26 MED ORDER — PHENYLEPHRINE HCL 10 MG/ML IJ SOLN
INTRAMUSCULAR | Status: DC | PRN
  Administered 2013-09-26: 80 ug via INTRAVENOUS

## 2013-09-26 MED ORDER — NALBUPHINE HCL 10 MG/ML IJ SOLN
5.0000 mg | INTRAMUSCULAR | Status: DC | PRN
  Filled 2013-09-26: qty 1

## 2013-09-26 MED ORDER — KETOROLAC TROMETHAMINE 30 MG/ML IJ SOLN
30.0000 mg | Freq: Four times a day (QID) | INTRAMUSCULAR | Status: DC | PRN
  Administered 2013-09-26: 30 mg via INTRAMUSCULAR

## 2013-09-26 MED ORDER — GLYCOPYRROLATE 0.2 MG/ML IJ SOLN
INTRAMUSCULAR | Status: AC
  Filled 2013-09-26: qty 1

## 2013-09-26 MED ORDER — LACTATED RINGERS IV SOLN: INTRAVENOUS | Status: DC

## 2013-09-26 MED ORDER — ACETAMINOPHEN 500 MG PO TABS
1000.0000 mg | ORAL_TABLET | Freq: Four times a day (QID) | ORAL | Status: AC
  Administered 2013-09-26 – 2013-09-27 (×2): 1000 mg via ORAL
  Filled 2013-09-26 (×3): qty 2

## 2013-09-26 MED ORDER — MENTHOL 3 MG MT LOZG: 1.0000 | LOZENGE | OROMUCOSAL | Status: DC | PRN

## 2013-09-26 MED ORDER — MORPHINE SULFATE 0.5 MG/ML IJ SOLN
INTRAMUSCULAR | Status: AC
  Filled 2013-09-26: qty 10

## 2013-09-26 MED ORDER — PROMETHAZINE HCL 25 MG/ML IJ SOLN: 6.2500 mg | INTRAMUSCULAR | Status: DC | PRN

## 2013-09-26 MED ORDER — ONDANSETRON HCL 4 MG PO TABS: 4.0000 mg | ORAL_TABLET | ORAL | Status: DC | PRN

## 2013-09-26 MED ORDER — SENNOSIDES-DOCUSATE SODIUM 8.6-50 MG PO TABS
2.0000 | ORAL_TABLET | ORAL | Status: DC
  Administered 2013-09-27 – 2013-09-28 (×3): 2 via ORAL
  Filled 2013-09-26 (×3): qty 2

## 2013-09-26 MED ORDER — ONDANSETRON HCL 4 MG/2ML IJ SOLN: 4.0000 mg | INTRAMUSCULAR | Status: DC | PRN

## 2013-09-26 MED ORDER — IBUPROFEN 600 MG PO TABS
600.0000 mg | ORAL_TABLET | Freq: Four times a day (QID) | ORAL | Status: DC
  Administered 2013-09-27 – 2013-09-29 (×9): 600 mg via ORAL
  Filled 2013-09-26 (×11): qty 1

## 2013-09-26 MED ORDER — DIBUCAINE 1 % RE OINT: 1.0000 "application " | TOPICAL_OINTMENT | RECTAL | Status: DC | PRN

## 2013-09-26 MED ORDER — LACTATED RINGERS IV SOLN
INTRAVENOUS | Status: DC
Start: 2013-09-26 — End: 2013-09-29
  Administered 2013-09-27: via INTRAVENOUS

## 2013-09-26 MED ORDER — WITCH HAZEL-GLYCERIN EX PADS: 1.0000 "application " | MEDICATED_PAD | CUTANEOUS | Status: DC | PRN

## 2013-09-26 MED ORDER — OXYTOCIN 40 UNITS IN LACTATED RINGERS INFUSION - SIMPLE MED: 62.5000 mL/h | INTRAVENOUS | Status: AC

## 2013-09-26 MED ORDER — NALOXONE HCL 1 MG/ML IJ SOLN: 1.0000 ug/kg/h | INTRAVENOUS | Status: DC | PRN

## 2013-09-26 MED ORDER — ONDANSETRON HCL 4 MG/2ML IJ SOLN
INTRAMUSCULAR | Status: AC
  Filled 2013-09-26: qty 2

## 2013-09-26 MED ORDER — CEFAZOLIN SODIUM-DEXTROSE 2-3 GM-% IV SOLR: 2.0000 g | INTRAVENOUS | Status: DC

## 2013-09-26 MED ORDER — BUPIVACAINE IN DEXTROSE 0.75-8.25 % IT SOLN
INTRATHECAL | Status: DC | PRN
  Administered 2013-09-26: 1.4 mL via INTRATHECAL

## 2013-09-26 MED ORDER — PHENYLEPHRINE 8 MG IN D5W 100 ML (0.08MG/ML) PREMIX OPTIME
INJECTION | INTRAVENOUS | Status: DC | PRN
  Administered 2013-09-26: 60 ug/min via INTRAVENOUS

## 2013-09-26 MED ORDER — KETOROLAC TROMETHAMINE 30 MG/ML IJ SOLN
INTRAMUSCULAR | Status: AC
  Administered 2013-09-26: 30 mg via INTRAMUSCULAR
  Filled 2013-09-26: qty 1

## 2013-09-26 MED ORDER — MIDAZOLAM HCL 2 MG/2ML IJ SOLN: 0.5000 mg | Freq: Once | INTRAMUSCULAR | Status: DC | PRN

## 2013-09-26 MED ORDER — KETOROLAC TROMETHAMINE 30 MG/ML IJ SOLN: 30.0000 mg | Freq: Four times a day (QID) | INTRAMUSCULAR | Status: DC | PRN

## 2013-09-26 SURGICAL SUPPLY — 31 items
ADH SKN CLS APL DERMABOND .7 (GAUZE/BANDAGES/DRESSINGS)
CLAMP CORD UMBIL (MISCELLANEOUS) IMPLANT
CLOTH BEACON ORANGE TIMEOUT ST (SAFETY) ×2 IMPLANT
DERMABOND ADVANCED (GAUZE/BANDAGES/DRESSINGS)
DERMABOND ADVANCED .7 DNX12 (GAUZE/BANDAGES/DRESSINGS) IMPLANT
DRAPE LG THREE QUARTER DISP (DRAPES) IMPLANT
DRSG OPSITE POSTOP 4X10 (GAUZE/BANDAGES/DRESSINGS) ×2 IMPLANT
DURAPREP 26ML APPLICATOR (WOUND CARE) ×2 IMPLANT
ELECT REM PT RETURN 9FT ADLT (ELECTROSURGICAL) ×2
ELECTRODE REM PT RTRN 9FT ADLT (ELECTROSURGICAL) ×1 IMPLANT
EXTRACTOR VACUUM M CUP 4 TUBE (SUCTIONS) IMPLANT
GLOVE BIO SURGEON STRL SZ7 (GLOVE) ×2 IMPLANT
GLOVE BIOGEL PI IND STRL 6.5 (GLOVE) IMPLANT
GLOVE BIOGEL PI INDICATOR 6.5 (GLOVE) ×2
GOWN STRL REIN XL XLG (GOWN DISPOSABLE) ×4 IMPLANT
KIT ABG SYR 3ML LUER SLIP (SYRINGE) IMPLANT
NDL HYPO 25X5/8 SAFETYGLIDE (NEEDLE) IMPLANT
NEEDLE HYPO 25X5/8 SAFETYGLIDE (NEEDLE) IMPLANT
NS IRRIG 1000ML POUR BTL (IV SOLUTION) ×2 IMPLANT
PACK C SECTION WH (CUSTOM PROCEDURE TRAY) ×2 IMPLANT
PAD OB MATERNITY 4.3X12.25 (PERSONAL CARE ITEMS) ×2 IMPLANT
RTRCTR C-SECT PINK 25CM LRG (MISCELLANEOUS) ×2 IMPLANT
STAPLER VISISTAT 35W (STAPLE) IMPLANT
SUT CHROMIC 1 CTX 36 (SUTURE) ×4 IMPLANT
SUT PDS AB 0 CTX 60 (SUTURE) ×2 IMPLANT
SUT VIC AB 2-0 CT1 27 (SUTURE) ×2
SUT VIC AB 2-0 CT1 TAPERPNT 27 (SUTURE) ×1 IMPLANT
SUT VIC AB 4-0 KS 27 (SUTURE) IMPLANT
TOWEL OR 17X24 6PK STRL BLUE (TOWEL DISPOSABLE) ×2 IMPLANT
TRAY FOLEY CATH 14FR (SET/KITS/TRAYS/PACK) ×2 IMPLANT
WATER STERILE IRR 1000ML POUR (IV SOLUTION) ×2 IMPLANT

## 2013-09-26 NOTE — H&P (Signed)
Brenda Welch is a 33 y.o. female presenting for repeat Cesarean Section  32 G5 P1031 at 5539 and 4 weeks estimated gestational age presents for repeat cesarean section. Her pregnancy has been complicated by gestational diabetes. She has been managed with oral glyburide.  History OB History   Grav Para Term Preterm Abortions TAB SAB Ect Mult Living   5 1 1  3     1      Past Medical History  Diagnosis Date  . Gestational diabetes    Past Surgical History  Procedure Laterality Date  . Umbilical hernia repair    . Cesarean section     Family History: family history is not on file. Social History:  reports that she has never smoked. She has never used smokeless tobacco. She reports that she does not drink alcohol or use illicit drugs.   Prenatal Transfer Tool  Maternal Diabetes: Yes:  Diabetes Type:  Insulin/Medication controlled Genetic Screening: Normal Maternal Ultrasounds/Referrals: Normal Fetal Ultrasounds or other Referrals:  None Maternal Substance Abuse:  No Significant Maternal Medications:  Meds include: Other: Glyburide Significant Maternal Lab Results:  None Other Comments:  None  ROS: As above    Blood pressure 118/68, pulse 70, temperature 98.8 F (37.1 C), temperature source Oral, resp. rate 16, height 5\' 1"  (1.549 m), weight 87.998 kg (194 lb), last menstrual period 12/23/2012, SpO2 100.00%. Exam Physical Exam  Prenatal labs: ABO, Rh: --/--/O POS (01/02 0827) Antibody: NEG (01/02 0827) Rubella: Immune (06/25 0831) RPR: NON REACTIVE (01/02 0827)  HBsAg: Negative (06/25 0831)  HIV: Non-reactive (06/25 0831)  GBS:   Negative  Assessment/Plan: 1) Admit 2) consent for repeat cesarean section. Risks, benefits, alternatives of repeat cesarean section were discussed with the patient and she consents to proceed 3) Accu-Chek 4) Ancef 2 g on call to the OR  Brenda Welch H. 09/26/2013, 11:57 AM

## 2013-09-26 NOTE — Consult Note (Signed)
Neonatology Note:   Attendance at C-section:    I was asked by Dr. Tenny Crawoss to attend this repeat C/S at term. The mother is a G5P1A3 O pos, GBS Neg with GDM, on glyburide. ROM at delivery, fluid clear. Infant vigorous with good spontaneous cry and tone. Needed only minimal bulb suctioning. Ap 9/9. Lungs clear to ausc in DR. To CN to care of Pediatrician.   Doretha Souhristie C. Keston Seever, MD

## 2013-09-26 NOTE — Progress Notes (Addendum)
Patient's lab work is still pending.the patient's hemoglobin was 9.8 on 09/22/2013. Type and screen is in process. We will proceed with cesarean section without a completed type and screen

## 2013-09-26 NOTE — Anesthesia Postprocedure Evaluation (Signed)
Anesthesia Post Note  Patient: Brenda Welch  Procedure(s) Performed: Procedure(s) (LRB): REPEAT CESAREAN SECTION (N/A)  Anesthesia type: Spinal  Patient location: PACU  Post pain: Pain level controlled  Post assessment: Post-op Vital signs reviewed  Last Vitals:  Filed Vitals:   09/26/13 1101  BP: 118/68  Pulse: 70  Temp: 37.1 C  Resp: 16    Post vital signs: Reviewed  Level of consciousness: awake  Complications: No apparent anesthesia complications

## 2013-09-26 NOTE — Anesthesia Preprocedure Evaluation (Signed)
Anesthesia Evaluation  Patient identified by MRN, date of birth, ID band Patient awake    Reviewed: Allergy & Precautions, H&P , NPO status , Patient's Chart, lab work & pertinent test results  Airway Mallampati: III      Dental   Pulmonary  breath sounds clear to auscultation        Cardiovascular Exercise Tolerance: Good Rhythm:regular Rate:Normal     Neuro/Psych    GI/Hepatic   Endo/Other  diabetesMorbid obesity  Renal/GU      Musculoskeletal   Abdominal   Peds  Hematology   Anesthesia Other Findings   Reproductive/Obstetrics (+) Pregnancy                           Anesthesia Physical Anesthesia Plan  ASA: III  Anesthesia Plan: Spinal   Post-op Pain Management:    Induction:   Airway Management Planned:   Additional Equipment:   Intra-op Plan:   Post-operative Plan:   Informed Consent: I have reviewed the patients History and Physical, chart, labs and discussed the procedure including the risks, benefits and alternatives for the proposed anesthesia with the patient or authorized representative who has indicated his/her understanding and acceptance.     Plan Discussed with: Anesthesiologist, CRNA and Surgeon  Anesthesia Plan Comments:         Anesthesia Quick Evaluation

## 2013-09-26 NOTE — Transfer of Care (Signed)
Immediate Anesthesia Transfer of Care Note  Patient: Brenda Welch  Procedure(s) Performed: Procedure(s): REPEAT CESAREAN SECTION (N/A)  Patient Location: PACU  Anesthesia Type:Spinal  Level of Consciousness: awake, alert  and oriented  Airway & Oxygen Therapy: Patient Spontanous Breathing  Post-op Assessment: Report given to PACU RN and Post -op Vital signs reviewed and stable  Post vital signs: Reviewed and stable  Complications: No apparent anesthesia complications

## 2013-09-26 NOTE — Anesthesia Procedure Notes (Signed)
Spinal  Patient location during procedure: OR Start time: 09/26/2013 12:31 PM End time: 09/26/2013 12:33 PM Reason for block: procedure for pain Staffing Anesthesiologist: Leilani AbleHATCHETT, Burrell Hodapp Performed by: anesthesiologist  Preanesthetic Checklist Completed: patient identified, surgical consent, pre-op evaluation, timeout performed, IV checked, risks and benefits discussed and monitors and equipment checked Spinal Block Patient position: sitting Prep: DuraPrep Patient monitoring: heart rate, cardiac monitor, continuous pulse ox and blood pressure Approach: midline Location: L3-4 Injection technique: single-shot Needle Needle type: Sprotte  Needle gauge: 24 G Needle length: 9 cm Needle insertion depth: 5 cm Assessment Sensory level: T4

## 2013-09-26 NOTE — Op Note (Signed)
Pre-Operative Diagnosis: 1) 39+4 week intrauterine pregnancy 2) history of prior cesarean section, declines trial of labor 3) A2 gestational diabetes mellitus Postoperative Diagnosis: Same Procedure: Repeat low transverse cesarean section via Pfannenstiel skin incision Surgeon: Dr. Waynard ReedsKendra Lorre Opdahl Assistant:Dr. Ilda Moriichard Kaplan Operative Findings: Vigorous female infant in the cephalic presentation with Apgar scores of 8 at 1 minute and 9 at 5 minutes. Normal-appearing ovaries, tubes, uterus Specimen: Placenta for disposal EBL: Total I/O In: 1500 [I.V.:1500] Out: 700 [Urine:100; Blood:600]   Procedure:Ms. Brenda Welch is an 33 year old gravida 5 para 1031 at 4339 weeks and 4 days estimated gestational age who presents for cesarean section. Following the appropriate informed consent the patient was brought to the operating room where spinal anesthesia was administered and found to be adequate. She was placed in the dorsal supine position with a leftward tilt. She was prepped and draped in the normal sterile fashion. Scalpel was then used to make a Pfannenstiel skin incision which was carried down to the underlying layers of soft tissue to the fascia. The fascia was incised in the midline and the fascial incision was extended laterally with Mayo scissors. The superior aspect of the fascial incision was grasped with Coker clamps x2, tented up and the rectus muscles dissected off sharply with the electrocautery unit area and the same procedure was repeated on the inferior aspect of the fascial incision. The rectus muscles were separated in the midline. The abdominal peritoneum was identified, tented up, entered sharply, and the incision was extended superiorly and inferiorly with good visualization of the bladder. The Alexis retractor was then deployed. The vesicouterine peritoneum was identified, tented up, entered sharply, and the bladder flap was created digitally. Scalpel was then used to make a low transverse  incision on the uterus which was extended laterally with blunt dissection. The fetal vertex was identified, delivered easily through the uterine incision followed by the body. The infant was bulb suctioned on the operative field cried vigorously, cord was clamped and cut and the infant was passed to the waiting neonatologist. Placenta was then delivered spontaneously, the uterus was cleared of all clot and debris. The uterine incision was repaired with #1 chromic in running locked fashion. Ovaries and tubes were inspected and normal. The Alexis retractor was removed. The abdominal peritoneum was reapproximated with 2-0 Vicryl in a running fashion, the rectus muscles was reapproximated with #1 chromic in a running fashion. The fascia was closed with a looped PDS in a running fashion. The skin was closed with 4-0 vicryl in a subcuticular fashion and Dermabond. All sponge lap and needle counts were correct x2. Patient tolerated the procedure well and recovered in stable condition following the procedure.

## 2013-09-27 ENCOUNTER — Encounter (HOSPITAL_COMMUNITY): Payer: Self-pay | Admitting: Obstetrics and Gynecology

## 2013-09-27 LAB — BIRTH TISSUE RECOVERY COLLECTION (PLACENTA DONATION)

## 2013-09-27 LAB — CBC
HEMATOCRIT: 27.5 % — AB (ref 36.0–46.0)
Hemoglobin: 9.1 g/dL — ABNORMAL LOW (ref 12.0–15.0)
MCH: 26.8 pg (ref 26.0–34.0)
MCHC: 33.1 g/dL (ref 30.0–36.0)
MCV: 80.9 fL (ref 78.0–100.0)
PLATELETS: 188 10*3/uL (ref 150–400)
RBC: 3.4 MIL/uL — ABNORMAL LOW (ref 3.87–5.11)
RDW: 15.5 % (ref 11.5–15.5)
WBC: 9.2 10*3/uL (ref 4.0–10.5)

## 2013-09-27 NOTE — Progress Notes (Signed)
Subjective: Postpartum Day 1: Cesarean Delivery Patient reports pain controlled, bleeding appropriate, breastfeeding going well  Objective: Vital signs in last 24 hours: Filed Vitals:   09/26/13 2051 09/26/13 2242 09/27/13 0040 09/27/13 0445  BP: 99/60 103/58  104/63  Pulse: 103 63 81 76  Temp:  99 F (37.2 C) 97.9 F (36.6 C) 98.1 F (36.7 C)  TempSrc:  Oral Axillary Axillary  Resp:  20 18 18   Height:      Weight:      SpO2:  98% 99% 100%    Physical Exam:  General: alert, cooperative and appears stated age 43Lochia: appropriate Uterine Fundus: firm Incision: healing well DVT Evaluation: No evidence of DVT seen on physical exam.   Recent Labs  09/27/13 0624  HGB 9.1*  HCT 27.5*    Assessment/Plan: Status post Cesarean section. Doing well postoperatively.  Continue current care.  Brenda Welch H. 09/27/2013, 8:54 AM

## 2013-09-27 NOTE — Anesthesia Postprocedure Evaluation (Signed)
  Anesthesia Post-op Note  Patient: Brenda Welch  Procedure(s) Performed: Procedure(s): REPEAT CESAREAN SECTION (N/A)  Patient Location: Mother/Baby  Anesthesia Type:Spinal  Level of Consciousness: awake, alert  and oriented  Airway and Oxygen Therapy: Patient Spontanous Breathing  Post-op Pain: none  Post-op Assessment: Post-op Vital signs reviewed, Patient's Cardiovascular Status Stable, No headache, No backache, No residual numbness and No residual motor weakness  Post-op Vital Signs: Reviewed and stable  Complications: No apparent anesthesia complications

## 2013-09-28 NOTE — Progress Notes (Signed)
Patient is eating, ambulating, voiding.  Pain control is good.  Appropriate lochia.  No issues.  Filed Vitals:   09/27/13 0908 09/27/13 1245 09/27/13 1800 09/28/13 0553  BP: 108/66  116/68 119/69  Pulse: 72 80 88 93  Temp: 98.2 F (36.8 C)  98.6 F (37 C) 98 F (36.7 C)  TempSrc: Oral  Oral Oral  Resp: 16 18 18 18   Height:      Weight:      SpO2: 99% 100%      Fundus firm Inc: c/d/i Ext; no CT  Lab Results  Component Value Date   WBC 9.2 09/27/2013   HGB 9.1* 09/27/2013   HCT 27.5* 09/27/2013   MCV 80.9 09/27/2013   PLT 188 09/27/2013    --/--/O POS (01/06 1105)  A/P Post op day #2 s/p c/s. Desires to stay one more night.  Routine care.  Expect d/c 1/9.    Philip AspenALLAHAN, Odessie Polzin

## 2013-09-29 MED ORDER — OXYCODONE-ACETAMINOPHEN 5-325 MG PO TABS: 1.0000 | ORAL_TABLET | ORAL | Status: DC | PRN

## 2013-09-29 NOTE — Lactation Note (Signed)
This note was copied from the chart of Brenda Welch. Lactation Consultation Note: Follow up visit before DC. Mom had baby latched to breast when I went into room. Reports that breasts are beginning to feel fuller this morning. No questions at present. To call prn  Patient Name: Brenda Truddie CocoCourtney Sommerfeld HQION'GToday's Date: 09/29/2013 Reason for consult: Follow-up assessment   Maternal Data Formula Feeding for Exclusion: No  Feeding Feeding Type: Breast Fed  LATCH Score/Interventions Latch: Grasps breast easily, tongue down, lips flanged, rhythmical sucking.  Audible Swallowing: A few with stimulation  Type of Nipple: Everted at rest and after stimulation  Comfort (Breast/Nipple): Soft / non-tender     Hold (Positioning): No assistance needed to correctly position infant at breast.  LATCH Score: 9  Lactation Tools Discussed/Used     Consult Status Consult Status: Complete    Pamelia HoitWeeks, Draven Laine D 09/29/2013, 7:57 AM

## 2013-09-29 NOTE — Progress Notes (Signed)
  Patient is eating, ambulating, voiding.  Pain control is good.  Filed Vitals:   09/27/13 1800 09/28/13 0553 09/28/13 1803 09/29/13 0537  BP: 116/68 119/69 122/78 115/61  Pulse: 88 93 90 73  Temp: 98.6 F (37 C) 98 F (36.7 C) 99.1 F (37.3 C) 98.4 F (36.9 C)  TempSrc: Oral Oral Oral Oral  Resp: 18 18 18 16   Height:      Weight:      SpO2:        lungs:   clear to auscultation cor:    RRR Abdomen:  soft, appropriate tenderness, incisions intact and without erythema or exudate ex:    no cords   Lab Results  Component Value Date   WBC 9.2 09/27/2013   HGB 9.1* 09/27/2013   HCT 27.5* 09/27/2013   MCV 80.9 09/27/2013   PLT 188 09/27/2013    --/--/O POS (01/06 1105)/RI  A/P    Post operative day 3.  Routine post op and postpartum care.  Expect d/c today.  Percocet for pain control.

## 2013-10-06 NOTE — Discharge Summary (Signed)
Obstetric Discharge Summary Reason for Admission: cesarean section Prenatal Procedures: none Intrapartum Procedures: cesarean: low cervical, transverse Postpartum Procedures: none Complications-Operative and Postpartum: none Hemoglobin  Date Value Range Status  09/27/2013 9.1* 12.0 - 15.0 g/dL Final     HCT  Date Value Range Status  09/27/2013 27.5* 36.0 - 46.0 % Final    Discharge Diagnoses: Term Pregnancy-delivered  Discharge Information: Date: 10/06/2013 Activity: pelvic rest Diet: routine Medications: Percocet Condition: stable Instructions: refer to practice specific booklet Discharge to: home   Newborn Data: Live born female  Birth Weight: 7 lb 12 oz (3515 g) APGAR: 9, 9  Home with mother.  Brenda Welch A 10/06/2013, 7:12 AM

## 2014-07-23 ENCOUNTER — Encounter (HOSPITAL_COMMUNITY): Payer: Self-pay | Admitting: Obstetrics and Gynecology

## 2017-09-20 ENCOUNTER — Other Ambulatory Visit: Payer: Self-pay | Admitting: Family Medicine

## 2017-09-20 DIAGNOSIS — E049 Nontoxic goiter, unspecified: Secondary | ICD-10-CM

## 2017-09-22 ENCOUNTER — Ambulatory Visit
Admission: RE | Admit: 2017-09-22 | Discharge: 2017-09-22 | Disposition: A | Discharge status: Home / Self Care | Payer: 59 | Source: Ambulatory Visit | Attending: Family Medicine | Admitting: Family Medicine

## 2017-09-22 DIAGNOSIS — E049 Nontoxic goiter, unspecified: Secondary | ICD-10-CM

## 2018-05-05 ENCOUNTER — Other Ambulatory Visit: Payer: Self-pay | Admitting: Family Medicine

## 2018-05-11 ENCOUNTER — Other Ambulatory Visit: Payer: Self-pay | Admitting: Family Medicine

## 2018-05-11 DIAGNOSIS — E041 Nontoxic single thyroid nodule: Secondary | ICD-10-CM

## 2018-05-17 ENCOUNTER — Ambulatory Visit
Admission: RE | Admit: 2018-05-17 | Discharge: 2018-05-17 | Disposition: A | Discharge status: Home / Self Care | Payer: 59 | Source: Ambulatory Visit | Attending: Family Medicine | Admitting: Family Medicine

## 2018-05-17 DIAGNOSIS — E041 Nontoxic single thyroid nodule: Secondary | ICD-10-CM

## 2018-05-26 ENCOUNTER — Other Ambulatory Visit: Payer: Self-pay | Admitting: Family Medicine

## 2018-05-26 DIAGNOSIS — E041 Nontoxic single thyroid nodule: Secondary | ICD-10-CM

## 2018-06-01 ENCOUNTER — Other Ambulatory Visit: Payer: Self-pay | Admitting: Family Medicine

## 2018-06-01 ENCOUNTER — Ambulatory Visit
Admission: RE | Admit: 2018-06-01 | Discharge: 2018-06-01 | Disposition: A | Discharge status: Home / Self Care | Payer: 59 | Source: Ambulatory Visit | Attending: Family Medicine | Admitting: Family Medicine

## 2018-06-01 DIAGNOSIS — E041 Nontoxic single thyroid nodule: Secondary | ICD-10-CM

## 2019-05-02 ENCOUNTER — Inpatient Hospital Stay (HOSPITAL_COMMUNITY)
Admission: AD | Admit: 2019-05-02 | Discharge: 2019-05-02 | Disposition: A | Discharge status: Home / Self Care | Payer: 59 | Attending: Obstetrics and Gynecology | Admitting: Obstetrics and Gynecology

## 2019-05-02 ENCOUNTER — Other Ambulatory Visit: Payer: Self-pay

## 2019-05-02 DIAGNOSIS — O2691 Pregnancy related conditions, unspecified, first trimester: Secondary | ICD-10-CM

## 2019-05-02 DIAGNOSIS — O00109 Unspecified tubal pregnancy without intrauterine pregnancy: Secondary | ICD-10-CM | POA: Diagnosis not present

## 2019-05-02 LAB — CBC
HCT: 39 % (ref 36.0–46.0)
Hemoglobin: 12.8 g/dL (ref 12.0–15.0)
MCH: 30.5 pg (ref 26.0–34.0)
MCHC: 32.8 g/dL (ref 30.0–36.0)
MCV: 93.1 fL (ref 80.0–100.0)
Platelets: 261 10*3/uL (ref 150–400)
RBC: 4.19 MIL/uL (ref 3.87–5.11)
RDW: 12.9 % (ref 11.5–15.5)
WBC: 5.5 10*3/uL (ref 4.0–10.5)
nRBC: 0 % (ref 0.0–0.2)

## 2019-05-02 LAB — COMPREHENSIVE METABOLIC PANEL
ALT: 18 U/L (ref 0–44)
AST: 19 U/L (ref 15–41)
Albumin: 4.1 g/dL (ref 3.5–5.0)
Alkaline Phosphatase: 37 U/L — ABNORMAL LOW (ref 38–126)
Anion gap: 8 (ref 5–15)
BUN: 8 mg/dL (ref 6–20)
CO2: 25 mmol/L (ref 22–32)
Calcium: 9.1 mg/dL (ref 8.9–10.3)
Chloride: 106 mmol/L (ref 98–111)
Creatinine, Ser: 0.96 mg/dL (ref 0.44–1.00)
GFR calc Af Amer: >60 mL/min (ref >60)
GFR calc non Af Amer: >60 mL/min (ref >60)
Glucose, Bld: 98 mg/dL (ref 70–99)
Potassium: 3.7 mmol/L (ref 3.5–5.1)
Sodium: 139 mmol/L (ref 135–145)
Total Bilirubin: 0.6 mg/dL (ref 0.3–1.2)
Total Protein: 7.2 g/dL (ref 6.5–8.1)

## 2019-05-02 MED ORDER — METHOTREXATE FOR ECTOPIC PREGNANCY
50.0000 mg/m2 | Freq: Once | INTRAMUSCULAR | Status: AC
  Administered 2019-05-02: 18:00:00 90 mg via INTRAMUSCULAR
  Filled 2019-05-02: qty 1

## 2019-05-02 NOTE — MAU Note (Signed)
Called Dr Elvis Coil office w/lab results.

## 2019-05-02 NOTE — MAU Note (Signed)
.   Brenda Welch is a 38 y.o. at Unknown here in MAU reporting: that she is being followed by Dr Rogue Bussing for ectopic pregnancy. Reports vaginal bleeding, denies any pain   LMP: 03/05/19  Onset of complaint: Pain score: 0 Vitals:   05/02/19 1424  BP: 123/78  Pulse: 64  Resp: 16  Temp: 98.6 F (37 C)  SpO2: 98%     FHT: Lab orders placed from triage:

## 2019-05-02 NOTE — Discharge Instructions (Signed)
Methotrexate Treatment for an Ectopic Pregnancy, Care After This sheet gives you information about how to care for yourself after your procedure. Your health care provider may also give you more specific instructions. If you have problems or questions, contact your health care provider. What can I expect after the procedure? After the procedure, it is common to have:  Abdominal cramping.  Vaginal bleeding.  Fatigue.  Nausea.  Vomiting.  Diarrhea. Blood tests will be taken at timed intervals for several days or weeks to check your pregnancy hormone levels. The blood tests will be done until the pregnancy hormone can no longer be detected in the blood. Follow these instructions at home: Activity  Do not have sex until your health care provider approves.  Limit activities that take a lot of effort as told by your health care provider. Medicines  Take over the counter and prescription medicines only as told by your health care provider.  Do not take aspirin, ibuprofen, naproxen, or any other NSAIDs.  Do not take folic acid, prenatal vitamins, or other vitamins that contain folic acid. General instructions   Do not drink alcohol.  Follow instructions from your health care provider on how and when to report any symptoms that may indicate a ruptured ectopic pregnancy.  Keep all follow-up visits as told by your health care provider. This is important. Contact a health care provider if:  You have persistent nausea and vomiting.  You have persistent diarrhea.  You are having a reaction to the medicine, such as: ? Tiredness. ? Skin rash. ? Hair loss. Get help right away if:  Your abdominal or pelvic pain gets worse.  You have more vaginal bleeding.  You feel light-headed or you faint.  You have shortness of breath.  Your heart rate increases.  You develop a cough.  You have chills.  You have a fever. Summary  After the procedure, it is common to have symptoms  of abdominal cramping, vaginal bleeding and fatigue. You may also experience other symptoms.  Blood tests will be taken at timed intervals for several days or weeks to check your pregnancy hormone levels. The blood tests will be done until the pregnancy hormone can no longer be detected in the blood.  Limit strenuous activity as told by your health care provider.  Follow instructions from your health care provider on how and when to report any symptoms that may indicate a ruptured ectopic pregnancy. This information is not intended to replace advice given to you by your health care provider. Make sure you discuss any questions you have with your health care provider. Document Released: 08/27/2011 Document Revised: 08/20/2017 Document Reviewed: 10/27/2016 Elsevier Patient Education  2020 [employer] Methotrexate injection What is this medicine? METHOTREXATE (METH oh TREX ate) is a chemotherapy drug used to treat cancer including breast cancer, leukemia, and lymphoma. This medicine can also be used to treat psoriasis and certain kinds of arthritis. This medicine may be used for other purposes; ask your health care provider or pharmacist if you have questions. What should I tell my health care provider before I take this medicine? They need to know if you have any of these conditions:  fluid in the stomach area or lungs  if you often drink alcohol  infection or immune system problems  kidney disease  liver disease  low blood counts, like low white cell, platelet, or red cell counts  lung disease  radiation therapy  stomach ulcers  ulcerative colitis  an unusual or allergic  reaction to methotrexate, other medicines, foods, dyes, or preservatives  pregnant or trying to get pregnant  breast-feeding How should I use this medicine? This medicine is for infusion into a vein or for injection into muscle or into the spinal fluid (whichever applies). It is usually given by a health  care professional in a hospital or clinic setting. In rare cases, you might get this medicine at home. You will be taught how to give this medicine. Use exactly as directed. Take your medicine at regular intervals. Do not take your medicine more often than directed. If this medicine is used for arthritis or psoriasis, it should be taken weekly, NOT daily. It is important that you put your used needles and syringes in a special sharps container. Do not put them in a trash can. If you do not have a sharps container, call your pharmacist or healthcare provider to get one. Talk to your pediatrician regarding the use of this medicine in children. While this drug may be prescribed for children as young as 2 years for selected conditions, precautions do apply. Overdosage: If you think you have taken too much of this medicine contact a poison control center or emergency room at once. NOTE: This medicine is only for you. Do not share this medicine with others. What if I miss a dose? It is important not to miss your dose. Call your doctor or health care professional if you are unable to keep an appointment. If you give yourself the medicine and you miss a dose, talk with your doctor or health care professional. Do not take double or extra doses. What may interact with this medicine? This medicine may interact with the following medications:  acitretin  aspirin or aspirin-like medicines including salicylates  azathioprine  certain antibiotics like chloramphenicol, penicillin, tetracycline  certain medicines for stomach problems like esomeprazole, omeprazole, pantoprazole  cyclosporine  gold  hydroxychloroquine  live virus vaccines  mercaptopurine  NSAIDs, medicines for pain and inflammation, like ibuprofen or naproxen  other cytotoxic agents  penicillamine  phenylbutazone  phenytoin  probenacid  retinoids such as isotretinoin and tretinoin  steroid medicines like prednisone or  cortisone  sulfonamides like sulfasalazine and trimethoprim/sulfamethoxazole  theophylline This list may not describe all possible interactions. Give your health care provider a list of all the medicines, herbs, non-prescription drugs, or dietary supplements you use. Also tell them if you smoke, drink alcohol, or use illegal drugs. Some items may interact with your medicine. What should I watch for while using this medicine? Avoid alcoholic drinks. In some cases, you may be given additional medicines to help with side effects. Follow all directions for their use. This medicine can make you more sensitive to the sun. Keep out of the sun. If you cannot avoid being in the sun, wear protective clothing and use sunscreen. Do not use sun lamps or tanning beds/booths. You may get drowsy or dizzy. Do not drive, use machinery, or do anything that needs mental alertness until you know how this medicine affects you. Do not stand or sit up quickly, especially if you are an older patient. This reduces the risk of dizzy or fainting spells. You may need blood work done while you are taking this medicine. Call your doctor or health care professional for advice if you get a fever, chills or sore throat, or other symptoms of a cold or flu. Do not treat yourself. This drug decreases your body's ability to fight infections. Try to avoid being around people who are  sick. This medicine may increase your risk to bruise or bleed. Call your doctor or health care professional if you notice any unusual bleeding. Check with your doctor or health care professional if you get an attack of severe diarrhea, nausea and vomiting, or if you sweat a lot. The loss of too much body fluid can make it dangerous for you to take this medicine. Talk to your doctor about your risk of cancer. You may be more at risk for certain types of cancers if you take this medicine. Both men and women must use effective birth control with this medicine. Do  not become pregnant while taking this medicine or until at least 1 normal menstrual cycle has occurred after stopping it. Women should inform their doctor if they wish to become pregnant or think they might be pregnant. Men should not father a child while taking this medicine and for 3 months after stopping it. There is a potential for serious side effects to an unborn child. Talk to your health care professional or pharmacist for more information. Do not breast-feed an infant while taking this medicine. What side effects may I notice from receiving this medicine? Side effects that you should report to your doctor or health care professional as soon as possible:  allergic reactions like skin rash, itching or hives, swelling of the face, lips, or tongue  back pain  breathing problems or shortness of breath  confusion  diarrhea  dry, nonproductive cough  low blood counts - this medicine may decrease the number of white blood cells, red blood cells and platelets. You may be at increased risk of infections and bleeding  mouth sores  redness, blistering, peeling or loosening of the skin, including inside the mouth  seizures  severe headaches  signs of infection - fever or chills, cough, sore throat, pain or difficulty passing urine  signs and symptoms of bleeding such as bloody or black, tarry stools; red or dark-brown urine; spitting up blood or brown material that looks like coffee grounds; red spots on the skin; unusual bruising or bleeding from the eye, gums, or nose  signs and symptoms of kidney injury like trouble passing urine or change in the amount of urine  signs and symptoms of liver injury like dark yellow or brown urine; general ill feeling or flu-like symptoms; light-colored stools; loss of appetite; nausea; right upper belly pain; unusually weak or tired; yellowing of the eyes or skin  stiff neck  vomiting Side effects that usually do not require medical attention  (report to your doctor or health care professional if they continue or are bothersome):  dizziness  hair loss  headache  stomach pain  upset stomach This list may not describe all possible side effects. Call your doctor for medical advice about side effects. You may report side effects to FDA at 1-800-FDA-1088. Where should I keep my medicine? If you are using this medicine at home, you will be instructed on how to store this medicine. Throw away any unused medicine after the expiration date on the label. NOTE: This sheet is a summary. It may not cover all possible information. If you have questions about this medicine, talk to your doctor, pharmacist, or health care provider.  2020 Elsevier/Gold Standard (2017-04-29 13:31:42)

## 2019-05-02 NOTE — MAU Note (Signed)
Per Dr Rogue Bussing, CBC and CMP orders placed. Will call Dr Rogue Bussing when results are in. She will place further orders.

## 2019-07-25 ENCOUNTER — Other Ambulatory Visit: Payer: Self-pay | Admitting: Internal Medicine

## 2019-07-27 ENCOUNTER — Other Ambulatory Visit: Payer: Self-pay | Admitting: Internal Medicine

## 2019-07-27 DIAGNOSIS — E042 Nontoxic multinodular goiter: Secondary | ICD-10-CM

## 2019-08-03 ENCOUNTER — Ambulatory Visit
Admission: RE | Admit: 2019-08-03 | Discharge: 2019-08-03 | Disposition: A | Discharge status: Home / Self Care | Payer: 59 | Source: Ambulatory Visit | Attending: Internal Medicine | Admitting: Internal Medicine

## 2019-08-03 DIAGNOSIS — E042 Nontoxic multinodular goiter: Secondary | ICD-10-CM

## 2019-08-23 LAB — OB RESULTS CONSOLE HEPATITIS B SURFACE ANTIGEN: Hepatitis B Surface Ag: NEGATIVE

## 2019-08-23 LAB — OB RESULTS CONSOLE GC/CHLAMYDIA
Chlamydia: NEGATIVE
Gonorrhea: NEGATIVE

## 2019-08-23 LAB — OB RESULTS CONSOLE ABO/RH: "RH Type ": POSITIVE

## 2019-08-23 LAB — OB RESULTS CONSOLE ANTIBODY SCREEN: Antibody Screen: NEGATIVE

## 2019-08-23 LAB — OB RESULTS CONSOLE RPR: RPR: NONREACTIVE

## 2019-08-23 LAB — OB RESULTS CONSOLE RUBELLA ANTIBODY, IGM: Rubella: IMMUNE

## 2019-08-23 LAB — OB RESULTS CONSOLE HIV ANTIBODY (ROUTINE TESTING): HIV: NONREACTIVE

## 2019-09-24 ENCOUNTER — Encounter (HOSPITAL_COMMUNITY): Payer: Self-pay | Admitting: Obstetrics and Gynecology

## 2019-09-24 ENCOUNTER — Inpatient Hospital Stay (HOSPITAL_COMMUNITY)
Admission: AD | Admit: 2019-09-24 | Discharge: 2019-09-24 | Disposition: A | Discharge status: Home / Self Care | Payer: 59 | Attending: Obstetrics and Gynecology | Admitting: Obstetrics and Gynecology

## 2019-09-24 ENCOUNTER — Other Ambulatory Visit: Payer: Self-pay

## 2019-09-24 DIAGNOSIS — N939 Abnormal uterine and vaginal bleeding, unspecified: Secondary | ICD-10-CM | POA: Diagnosis present

## 2019-09-24 DIAGNOSIS — O209 Hemorrhage in early pregnancy, unspecified: Secondary | ICD-10-CM | POA: Diagnosis not present

## 2019-09-24 DIAGNOSIS — O34219 Maternal care for unspecified type scar from previous cesarean delivery: Secondary | ICD-10-CM | POA: Diagnosis not present

## 2019-09-24 DIAGNOSIS — Z6740 Type O blood, Rh positive: Secondary | ICD-10-CM

## 2019-09-24 DIAGNOSIS — Z3A11 11 weeks gestation of pregnancy: Secondary | ICD-10-CM | POA: Diagnosis not present

## 2019-09-24 HISTORY — DX: Autoimmune thyroiditis: E06.3

## 2019-09-24 LAB — URINALYSIS, ROUTINE W REFLEX MICROSCOPIC
Bilirubin Urine: NEGATIVE
Glucose, UA: NEGATIVE mg/dL
Hgb urine dipstick: NEGATIVE
Ketones, ur: NEGATIVE mg/dL
Leukocytes,Ua: NEGATIVE
Nitrite: NEGATIVE
Protein, ur: NEGATIVE mg/dL
Specific Gravity, Urine: 1.002 — ABNORMAL LOW (ref 1.005–1.030)
pH: 6 (ref 5.0–8.0)

## 2019-09-24 LAB — POCT PREGNANCY, URINE: Preg Test, Ur: POSITIVE — AB

## 2019-09-24 NOTE — MAU Note (Signed)
Brenda Welch is a 39 y.o. at [redacted]w[redacted]d here in MAU reporting: when using the bathroom today she noticed some bright red bleeding. Put a pad on and came over to MAU, did not see any bleeding on the pad when changing here. Was having pain but that has subsided. No recent IC. Has been seen in the office and states everything has looked okay.  Onset of complaint: today  Pain score: 0/10  Vitals:   09/24/19 1349  BP: 127/75  Pulse: 90  Resp: 16  Temp: 98.6 F (37 C)  SpO2: 100%     FHT: 172  Lab orders placed from triage: UA

## 2019-09-24 NOTE — Discharge Instructions (Signed)
Activity Restriction During Pregnancy Your health care provider may recommend specific activity restrictions during pregnancy for a variety of reasons. Activity restriction may require that you limit activities that require great effort, such as exercise, lifting, or sex. The type of activity restriction will vary for each person, depending on your risk or the problems you are having. Activity restriction may be recommended for a period of time until your baby is delivered. Why are activity restrictions recommended? Activity restriction may be recommended if:  Your placenta is partially or completely covering the opening of your cervix (placenta previa).  There is bleeding between the wall of the uterus and the amniotic sac in the first trimester of pregnancy (subchorionic hemorrhage).  You went into labor too early (preterm labor).  You have a history of miscarriage.  You have a condition that causes high blood pressure during pregnancy (preeclampsia or eclampsia).  You are pregnant with more than one baby.  Your baby is not growing well. What are the risks? The risks depend on your specific restriction. Strict bed rest has the most physical and emotional risks and is no longer routinely recommended. Risks of strict bed rest include:  Loss of muscle conditioning from not moving.  Blood clots.  Social isolation.  Depression.  Loss of income. Talk with your health care team about activity restriction to decide if it is best for you and your baby. Even if you are having problems during your pregnancy, you may be able to continue with normal levels of activity with careful monitoring by your health care team. Follow these instructions at home: If needed, based on your overall health and the health of your baby, your health care provider will decide which type of activity restriction is right for you. Activity restrictions may include:  Not lifting anything heavier than 10 pounds (4.5  kg).  Avoiding activities that take a lot of physical effort.  No lifting or straining.  Resting in a sitting position or lying down for periods of time during the day. Pelvic rest may be recommended along with activity restrictions. If pelvic rest is recommended, then:  Do not have sex, an orgasm, or use sexual stimulation.  Do not use tampons. Do not douche. Do not put anything into your vagina.  Do not lift anything that is heavier than 10 lb (4.5 kg).  Avoid activities that require a lot of effort.  Avoid any activity in which your pelvic muscles could become strained, such as squatting. Questions to ask your health care provider  Why is my activity being limited?  How will activity restrictions affect my body?  Why is rest helpful for me and my baby?  What activities can I do?  When can I return to normal activities? When should I seek immediate medical care? Seek immediate medical care if you have:  Vaginal bleeding.  Vaginal discharge.  Cramping pain in your lower abdomen.  Regular contractions.  A low, dull backache. Summary  Your health care provider may recommend specific activity restrictions during pregnancy for a variety of reasons.  Activity restriction may require that you limit activities such as exercise, lifting, sex, or any other activity that requires great effort.  Discuss the risks and benefits of activity restriction with your health care team to decide if it is best for you and your baby.  Contact your health care provider right away if you think you are having contractions, or if you notice vaginal bleeding, discharge, or cramping. This information is not   intended to replace advice given to you by your health care provider. Make sure you discuss any questions you have with your health care provider. Document Revised: 05/31/2019 Document Reviewed: 12/28/2017 Elsevier Patient Education  2020 [employer] Inc.  

## 2019-09-24 NOTE — MAU Provider Note (Signed)
History     CSN: [id number]  Arrival date and time: 09/24/19 1324   First Provider Initiated Contact with Patient 09/24/19 1425      Chief Complaint  Patient presents with  . Vaginal Bleeding   HPI   Ms.Brenda Welch is a 39 y.o. female [phone] @ [redacted]w[redacted]d here in MAU with complaints of vaginal bleeding. States she has had off and on vaginal bleeding in the first trimester. She says this morning she woke up and the bleeding was similar to a period. No recent intercourse. She has noticed some "dull ache" pains in her lower abdomen which is not a new pain. She has not taken any medication.  States since she has been here she has not noticed much bleeding. She was seen on 12/29 in the office and had a normal [place].   OB History    Gravida  6   Para  2   Term  2   Preterm      AB  3   Living  2     SAB      TAB      Ectopic      Multiple      Live Births  2           Past Medical History:  Diagnosis Date  . Gestational diabetes   . Hashimoto's disease     Past Surgical History:  Procedure Laterality Date  . CESAREAN SECTION    . CESAREAN SECTION N/A 09/26/2013   Procedure: REPEAT CESAREAN SECTION;  Surgeon: Freddrick March. Tenny Craw, MD;  Location: [facility];  Service: Obstetrics;  Laterality: N/A;  . UMBILICAL HERNIA REPAIR      History reviewed. No pertinent family history.  Social History   Tobacco Use  . Smoking status: Never Smoker  . Smokeless tobacco: Never Used  Substance Use Topics  . Alcohol use: No  . Drug use: No    Allergies: No Known Allergies  No medications prior to admission.   Results for orders placed or performed during the hospital encounter of 09/24/19 (from the past 48 hour(s))  Urinalysis, Routine w reflex microscopic     Status: Abnormal   Collection Time: 09/24/19  1:38 PM  Result Value Ref Range   Color, Urine COLORLESS (A) YELLOW   APPearance CLEAR CLEAR   Specific Gravity, Urine 1.002 (L) 1.005 - 1.030   pH 6.0 5.0 - 8.0   Glucose, UA NEGATIVE NEGATIVE mg/dL   Hgb urine dipstick NEGATIVE NEGATIVE   Bilirubin Urine NEGATIVE NEGATIVE   Ketones, ur NEGATIVE NEGATIVE mg/dL   Protein, ur NEGATIVE NEGATIVE mg/dL   Nitrite NEGATIVE NEGATIVE   Leukocytes,Ua NEGATIVE NEGATIVE    Comment: Performed at [facility], [address], [place], [place] [address]  Pregnancy, urine POC     Status: Abnormal   Collection Time: 09/24/19  1:39 PM  Result Value Ref Range   Preg Test, Ur POSITIVE (A) NEGATIVE    Comment:        THE SENSITIVITY OF THIS METHODOLOGY IS >24 mIU/mL    Review of Systems  Constitutional: Negative for fever.  Gastrointestinal: Negative for abdominal pain, nausea and vomiting.  Genitourinary: Positive for vaginal bleeding.   Physical Exam   Blood pressure 127/75, pulse 90, temperature 98.6 F (37 C), temperature source Oral, resp. rate 16, height 5\' 1"  (1.549 m), weight 79 kg, last menstrual period 03/05/2019, SpO2 100 %, unknown if currently breastfeeding.  Physical Exam  Constitutional: She is  oriented to person, place, and time. She appears well-developed and well-nourished. No distress.  HENT:  Head: Normocephalic.  Respiratory: Effort normal.  GI: Soft. She exhibits no distension. There is no abdominal tenderness. There is no rebound and no guarding.  Genitourinary: There is no rash on the right labia. There is no rash on the left labia.    Vaginal bleeding present.  There is bleeding in the vagina.    Genitourinary Comments: Vagina - Small amount of pink vaginal discharge, no odor  Cervix - No active bleeding from cervix. Small amount of pink discharge in the vault.  Bimanual exam: Cervix closed, posterior.  Chaperone present for exam.    Musculoskeletal:        General: Normal range of motion.  Neurological: She is alert and oriented to person, place, and time.  Skin: Skin is warm. She is not diaphoretic.  Psychiatric: Her behavior is normal.   MAU Course  Procedures  Pt  informed that the ultrasound is considered a limited OB ultrasound and is not intended to be a complete ultrasound exam.  Patient also informed that the ultrasound is not being completed with the intent of assessing for fetal or placental anomalies or any pelvic abnormalities.  Explained that the purpose of today's ultrasound is to assess for  viability.  Patient acknowledges the purpose of the exam and the limitations of the study.  Active fetus.    MDM  + fetal heart tones via doppler Bedside US shows active fetus O positive blood type.    Assessment and Plan   A:  1. Vaginal bleeding before [redacted] weeks gestation   2. [redacted] weeks gestation of pregnancy   3. Type O blood, Rh positive   4. Cesarean delivery delivered     P:  Discharge home with strict return precautions Encouraged pelvic rest Message sent to the office for f/u this week. Return to MAU if symptoms worsen.   Lezlie Lye, NP 09/24/2019 2:53 PM

## 2020-01-17 ENCOUNTER — Other Ambulatory Visit: Payer: Self-pay

## 2020-01-17 ENCOUNTER — Encounter: Payer: 59 | Attending: Obstetrics and Gynecology | Admitting: Registered"

## 2020-01-17 DIAGNOSIS — O99810 Abnormal glucose complicating pregnancy: Secondary | ICD-10-CM | POA: Insufficient documentation

## 2020-01-18 ENCOUNTER — Encounter: Payer: Self-pay | Admitting: Registered"

## 2020-01-18 DIAGNOSIS — O99810 Abnormal glucose complicating pregnancy: Secondary | ICD-10-CM | POA: Insufficient documentation

## 2020-01-18 NOTE — Progress Notes (Signed)
Patient was seen on 01/17/20 for Gestational Diabetes self-management class at the [facility]. The following learning objectives were met by the patient during this course:   States the definition of Gestational Diabetes  States why dietary management is important in controlling blood glucose  Describes the effects each nutrient has on blood glucose levels  Demonstrates ability to create a balanced meal plan  Demonstrates carbohydrate counting   States when to check blood glucose levels  Demonstrates proper blood glucose monitoring techniques  States the effect of stress and exercise on blood glucose levels  States the importance of limiting caffeine and abstaining from alcohol and smoking  Blood glucose monitor given: [employer] Lot # [id number] x Exp: 10/21/20 Blood glucose reading: 110 mg/dL  Patient instructed to monitor glucose levels: FBS: 60 - <95; 1 hour: <140; 2 hour: <120  Patient received handouts:  Nutrition Diabetes and Pregnancy, including carb counting list  Patient will be seen for follow-up as needed.

## 2020-02-23 ENCOUNTER — Other Ambulatory Visit: Payer: Self-pay | Admitting: Obstetrics and Gynecology

## 2020-03-12 ENCOUNTER — Other Ambulatory Visit: Payer: Self-pay

## 2020-03-12 ENCOUNTER — Inpatient Hospital Stay (HOSPITAL_COMMUNITY)
Admission: AD | Admit: 2020-03-12 | Discharge: 2020-03-12 | Disposition: A | Discharge status: Home / Self Care | Payer: 59 | Attending: Obstetrics and Gynecology | Admitting: Obstetrics and Gynecology

## 2020-03-12 DIAGNOSIS — O09523 Supervision of elderly multigravida, third trimester: Secondary | ICD-10-CM | POA: Diagnosis not present

## 2020-03-12 DIAGNOSIS — Z79890 Hormone replacement therapy (postmenopausal): Secondary | ICD-10-CM | POA: Insufficient documentation

## 2020-03-12 DIAGNOSIS — Z7982 Long term (current) use of aspirin: Secondary | ICD-10-CM | POA: Insufficient documentation

## 2020-03-12 DIAGNOSIS — Z3A35 35 weeks gestation of pregnancy: Secondary | ICD-10-CM | POA: Diagnosis not present

## 2020-03-12 DIAGNOSIS — O368330 Maternal care for abnormalities of the fetal heart rate or rhythm, third trimester, not applicable or unspecified: Secondary | ICD-10-CM

## 2020-03-12 DIAGNOSIS — Z3689 Encounter for other specified antenatal screening: Secondary | ICD-10-CM

## 2020-03-12 DIAGNOSIS — Z7984 Long term (current) use of oral hypoglycemic drugs: Secondary | ICD-10-CM | POA: Insufficient documentation

## 2020-03-12 NOTE — MAU Note (Signed)
Pt sent from [facility] office for prolonged EFM secondary nonreactive NST.  Reports +FM.  Denies VB or LOF.

## 2020-03-12 NOTE — Discharge Instructions (Signed)

## 2020-03-12 NOTE — MAU Provider Note (Signed)
History     CSN: [id number]  Arrival date and time: 03/12/20 1627   First Provider Initiated Contact with Patient 03/12/20 1721       Chief Complaint  Patient presents with  . EFM   HPI Brenda Welch is a 39 y.o. [id number] at [redacted]w[redacted]d sent from clinic for prolonged monitoring after non-reactive NST. She denies vaginal bleeding, leaking of fluid, decreased fetal movement, fever, falls, or recent illness.   She receives care with [facility].  OB History    Gravida  6   Para  2   Term  2   Preterm      AB  3   Living  2     SAB      TAB      Ectopic      Multiple      Live Births  2           Past Medical History:  Diagnosis Date  . Gestational diabetes   . Hashimoto's disease     Past Surgical History:  Procedure Laterality Date  . CESAREAN SECTION    . CESAREAN SECTION N/A 09/26/2013   Procedure: REPEAT CESAREAN SECTION;  Surgeon: Freddrick March. Tenny Craw, MD;  Location: [facility];  Service: Obstetrics;  Laterality: N/A;  . UMBILICAL HERNIA REPAIR      No family history on file.  Social History   Tobacco Use  . Smoking status: Never Smoker  . Smokeless tobacco: Never Used  Substance Use Topics  . Alcohol use: No  . Drug use: No    Allergies: No Known Allergies  Medications Prior to Admission  Medication Sig Dispense Refill Last Dose  . aspirin EC 81 MG tablet Take 81 mg by mouth daily. Swallow whole.   03/12/2020 at Unknown time  . glyBURIDE (DIABETA) 5 MG tablet Take 10 mg by mouth 2 (two) times daily with a meal. Gestational diabetes   03/12/2020 at Unknown time  . levothyroxine (SYNTHROID) 137 MCG tablet Take 137 mcg by mouth daily before breakfast.   03/12/2020 at Unknown time  . Prenatal Vit-Fe Fumarate-FA (PRENATAL MULTIVITAMIN) TABS tablet Take 1 tablet by mouth daily at 12 noon.   03/11/2020 at Unknown time    Review of Systems  Gastrointestinal: Negative for abdominal pain.  Genitourinary: Negative for vaginal bleeding.  Musculoskeletal:  Negative for back pain.  All other systems reviewed and are negative.  Physical Exam   Blood pressure 116/69, pulse 79, temperature 98.2 F (36.8 C), temperature source Oral, resp. rate 19, height 5\' 1"  (1.549 m), weight 91.5 kg, last menstrual period 03/05/2019, SpO2 98 %, unknown if currently breastfeeding.  Physical Exam  Nursing note and vitals reviewed. Cardiovascular: Normal rate and normal pulses.  Respiratory: Effort normal.  GI: Normal appearance.  Gravid  Neurological: She is alert.    MAU Course  Procedures  --Reactive tracing: baseline 135, moderate variability, positive accels, no decels --Toco: Occasional contractions not felt by patient  Patient Vitals for the past 24 hrs:  BP Temp Temp src Pulse Resp SpO2 Height Weight  03/12/20 1809 119/71 -- -- -- -- -- -- --  03/12/20 1806 -- -- -- -- -- 100 % -- --  03/12/20 1800 -- -- -- -- -- 98 % -- --  03/12/20 1645 116/69 98.2 F (36.8 C) Oral 79 19 98 % -- --  03/12/20 1642 -- -- -- -- -- -- 5\' 1"  (1.549 m) 91.5 kg   Assessment and Plan  --38 y.o.  [id number] at [redacted]w[redacted]d  --Reactive tracing throughout one hour of prolonged monitoring --BPP not indicated --Discharge home in stable condition  Darlina Rumpf, CNM 03/12/2020, 8:11 PM

## 2020-03-15 LAB — OB RESULTS CONSOLE GBS: GBS: NEGATIVE

## 2020-03-25 ENCOUNTER — Encounter (HOSPITAL_COMMUNITY): Payer: Self-pay

## 2020-03-25 NOTE — Patient Instructions (Addendum)
Senya Hinzman  03/25/2020   Your procedure is scheduled on:  04/09/2020  Arrive at 0530 at Entrance C on [address] at [facility], [place]  and [employer]. You are invited to use the FREE valet parking or use the Visitor's parking deck.  Pick up the phone at the desk and dial [phone].  Call this number if you have problems the morning of surgery: [phone]  Remember:   Do not eat food:(After Midnight) Desps de medianoche.  Do not drink clear liquids: (After Midnight) Desps de medianoche.  Take these medicines the morning of surgery with A SIP OF WATER:  Take synthroid as directed   Do not wear jewelry, make-up or nail polish.  Do not wear lotions, powders, or perfumes. Do not wear deodorant.  Do not shave 48 hours prior to surgery.  Do not bring valuables to the hospital.  [facility] is not   responsible for any belongings or valuables brought to the hospital.  Contacts, dentures or bridgework may not be worn into surgery.  Leave suitcase in the car. After surgery it may be brought to your room.  For patients admitted to the hospital, checkout time is 11:00 AM the day of              discharge.      Please read over the following fact sheets that you were given:     Preparing for Surgery

## 2020-04-07 ENCOUNTER — Other Ambulatory Visit: Payer: Self-pay

## 2020-04-07 ENCOUNTER — Other Ambulatory Visit (HOSPITAL_COMMUNITY)
Admission: RE | Admit: 2020-04-07 | Discharge: 2020-04-07 | Disposition: A | Discharge status: Home / Self Care | Payer: 59 | Source: Ambulatory Visit | Attending: Obstetrics and Gynecology | Admitting: Obstetrics and Gynecology

## 2020-04-07 DIAGNOSIS — Z20822 Contact with and (suspected) exposure to covid-19: Secondary | ICD-10-CM | POA: Insufficient documentation

## 2020-04-07 DIAGNOSIS — O34211 Maternal care for low transverse scar from previous cesarean delivery: Secondary | ICD-10-CM | POA: Diagnosis not present

## 2020-04-07 DIAGNOSIS — O26893 Other specified pregnancy related conditions, third trimester: Secondary | ICD-10-CM | POA: Diagnosis not present

## 2020-04-07 DIAGNOSIS — O471 False labor at or after 37 completed weeks of gestation: Secondary | ICD-10-CM | POA: Diagnosis not present

## 2020-04-07 DIAGNOSIS — Z3A39 39 weeks gestation of pregnancy: Secondary | ICD-10-CM | POA: Diagnosis not present

## 2020-04-07 LAB — CBC
HCT: 35.6 % — ABNORMAL LOW (ref 36.0–46.0)
Hemoglobin: 11.4 g/dL — ABNORMAL LOW (ref 12.0–15.0)
MCH: 27.7 pg (ref 26.0–34.0)
MCHC: 32 g/dL (ref 30.0–36.0)
MCV: 86.4 fL (ref 80.0–100.0)
Platelets: 202 10*3/uL (ref 150–400)
RBC: 4.12 MIL/uL (ref 3.87–5.11)
RDW: 15.6 % — ABNORMAL HIGH (ref 11.5–15.5)
WBC: 6.2 10*3/uL (ref 4.0–10.5)
nRBC: 0 % (ref 0.0–0.2)

## 2020-04-07 LAB — RPR: RPR Ser Ql: NONREACTIVE

## 2020-04-07 LAB — SARS CORONAVIRUS 2 (TAT 6-24 HRS): SARS Coronavirus 2: NEGATIVE

## 2020-04-07 LAB — TYPE AND SCREEN
ABO/RH(D): O POS
Antibody Screen: NEGATIVE

## 2020-04-07 NOTE — MAU Note (Signed)
Here for covid swab and lab draw. Denies symptoms or sick contacts. Swab collected. 

## 2020-04-08 ENCOUNTER — Inpatient Hospital Stay (EMERGENCY_DEPARTMENT_HOSPITAL)
Admission: AD | Admit: 2020-04-08 | Discharge: 2020-04-08 | Disposition: A | Discharge status: Home / Self Care | Payer: 59 | Source: Home / Self Care | Attending: Obstetrics and Gynecology | Admitting: Obstetrics and Gynecology

## 2020-04-08 ENCOUNTER — Encounter (HOSPITAL_COMMUNITY): Payer: Self-pay | Admitting: Obstetrics & Gynecology

## 2020-04-08 ENCOUNTER — Encounter (HOSPITAL_COMMUNITY): Payer: Self-pay | Admitting: Obstetrics and Gynecology

## 2020-04-08 ENCOUNTER — Inpatient Hospital Stay (HOSPITAL_COMMUNITY): Payer: 59 | Admitting: Anesthesiology

## 2020-04-08 ENCOUNTER — Other Ambulatory Visit: Payer: Self-pay

## 2020-04-08 ENCOUNTER — Inpatient Hospital Stay (HOSPITAL_COMMUNITY): Admission: RE | Admit: 2020-04-08 | Payer: 59 | Source: Home / Self Care | Admitting: Obstetrics and Gynecology

## 2020-04-08 ENCOUNTER — Inpatient Hospital Stay (HOSPITAL_COMMUNITY)
Admission: AD | Admit: 2020-04-08 | Discharge: 2020-04-10 | DRG: 788 | Disposition: A | Discharge status: Home / Self Care | Payer: 59 | Attending: Obstetrics & Gynecology | Admitting: Obstetrics & Gynecology

## 2020-04-08 ENCOUNTER — Encounter (HOSPITAL_COMMUNITY)
Admission: AD | Disposition: A | Discharge status: Home / Self Care | Payer: Self-pay | Source: Home / Self Care | Attending: Obstetrics & Gynecology

## 2020-04-08 DIAGNOSIS — O479 False labor, unspecified: Secondary | ICD-10-CM

## 2020-04-08 DIAGNOSIS — Z20822 Contact with and (suspected) exposure to covid-19: Secondary | ICD-10-CM | POA: Diagnosis present

## 2020-04-08 DIAGNOSIS — O471 False labor at or after 37 completed weeks of gestation: Secondary | ICD-10-CM | POA: Diagnosis not present

## 2020-04-08 DIAGNOSIS — O99214 Obesity complicating childbirth: Secondary | ICD-10-CM | POA: Diagnosis present

## 2020-04-08 DIAGNOSIS — O24425 Gestational diabetes mellitus in childbirth, controlled by oral hypoglycemic drugs: Secondary | ICD-10-CM | POA: Diagnosis present

## 2020-04-08 DIAGNOSIS — O34211 Maternal care for low transverse scar from previous cesarean delivery: Secondary | ICD-10-CM | POA: Diagnosis present

## 2020-04-08 DIAGNOSIS — E039 Hypothyroidism, unspecified: Secondary | ICD-10-CM | POA: Diagnosis present

## 2020-04-08 DIAGNOSIS — Z3A39 39 weeks gestation of pregnancy: Secondary | ICD-10-CM

## 2020-04-08 DIAGNOSIS — Z98891 History of uterine scar from previous surgery: Secondary | ICD-10-CM

## 2020-04-08 DIAGNOSIS — O99284 Endocrine, nutritional and metabolic diseases complicating childbirth: Secondary | ICD-10-CM | POA: Diagnosis present

## 2020-04-08 DIAGNOSIS — O26893 Other specified pregnancy related conditions, third trimester: Secondary | ICD-10-CM | POA: Diagnosis present

## 2020-04-08 HISTORY — DX: Hypothyroidism, unspecified: E03.9

## 2020-04-08 LAB — POCT FERN TEST: POCT Fern Test: POSITIVE

## 2020-04-08 LAB — CBC
HCT: 36.9 % (ref 36.0–46.0)
Hemoglobin: 12.1 g/dL (ref 12.0–15.0)
MCH: 28.3 pg (ref 26.0–34.0)
MCHC: 32.8 g/dL (ref 30.0–36.0)
MCV: 86.4 fL (ref 80.0–100.0)
Platelets: 216 10*3/uL (ref 150–400)
RBC: 4.27 MIL/uL (ref 3.87–5.11)
RDW: 15.9 % — ABNORMAL HIGH (ref 11.5–15.5)
WBC: 9.4 10*3/uL (ref 4.0–10.5)
nRBC: 0 % (ref 0.0–0.2)

## 2020-04-08 LAB — GLUCOSE, CAPILLARY: Glucose-Capillary: 111 mg/dL — ABNORMAL HIGH (ref 70–99)

## 2020-04-08 SURGERY — Surgical Case
Anesthesia: Spinal | Wound class: Clean Contaminated

## 2020-04-08 MED ORDER — DIPHENHYDRAMINE HCL 25 MG PO CAPS: 25.0000 mg | ORAL_CAPSULE | ORAL | Status: DC | PRN

## 2020-04-08 MED ORDER — MEPERIDINE HCL 25 MG/ML IJ SOLN: 6.2500 mg | INTRAMUSCULAR | Status: DC | PRN

## 2020-04-08 MED ORDER — FENTANYL CITRATE (PF) 100 MCG/2ML IJ SOLN
INTRAMUSCULAR | Status: DC | PRN
  Administered 2020-04-08: 15 ug via INTRATHECAL

## 2020-04-08 MED ORDER — KETOROLAC TROMETHAMINE 30 MG/ML IJ SOLN
INTRAMUSCULAR | Status: AC
  Filled 2020-04-08: qty 1

## 2020-04-08 MED ORDER — PHENYLEPHRINE HCL-NACL 20-0.9 MG/250ML-% IV SOLN
INTRAVENOUS | Status: DC | PRN
  Administered 2020-04-08: 60 ug/min via INTRAVENOUS

## 2020-04-08 MED ORDER — ACETAMINOPHEN 500 MG PO TABS
1000.0000 mg | ORAL_TABLET | Freq: Four times a day (QID) | ORAL | Status: AC
  Administered 2020-04-09 (×3): 1000 mg via ORAL
  Filled 2020-04-08 (×3): qty 2

## 2020-04-08 MED ORDER — SODIUM CHLORIDE 0.9 % IV SOLN: INTRAVENOUS | Status: DC | PRN

## 2020-04-08 MED ORDER — SODIUM CHLORIDE 0.9 % IV SOLN
500.0000 mg | INTRAVENOUS | Status: AC
  Administered 2020-04-08: 500 mg via INTRAVENOUS
  Filled 2020-04-08: qty 500

## 2020-04-08 MED ORDER — MORPHINE SULFATE (PF) 0.5 MG/ML IJ SOLN
INTRAMUSCULAR | Status: AC
  Filled 2020-04-08: qty 10

## 2020-04-08 MED ORDER — SODIUM CHLORIDE 0.9% FLUSH: 3.0000 mL | INTRAVENOUS | Status: DC | PRN

## 2020-04-08 MED ORDER — KETOROLAC TROMETHAMINE 30 MG/ML IJ SOLN: 30.0000 mg | Freq: Once | INTRAMUSCULAR | Status: AC | PRN

## 2020-04-08 MED ORDER — OXYTOCIN-SODIUM CHLORIDE 30-0.9 UT/500ML-% IV SOLN
INTRAVENOUS | Status: AC
  Filled 2020-04-08: qty 500

## 2020-04-08 MED ORDER — OXYTOCIN-SODIUM CHLORIDE 30-0.9 UT/500ML-% IV SOLN
INTRAVENOUS | Status: DC | PRN
  Administered 2020-04-08: 30 [IU] via INTRAVENOUS

## 2020-04-08 MED ORDER — ONDANSETRON HCL 4 MG/2ML IJ SOLN
4.0000 mg | Freq: Three times a day (TID) | INTRAMUSCULAR | Status: DC | PRN
  Administered 2020-04-09: 4 mg via INTRAVENOUS
  Filled 2020-04-08: qty 2

## 2020-04-08 MED ORDER — SCOPOLAMINE 1 MG/3DAYS TD PT72
MEDICATED_PATCH | TRANSDERMAL | Status: AC
  Filled 2020-04-08: qty 1

## 2020-04-08 MED ORDER — PROMETHAZINE HCL 25 MG/ML IJ SOLN: 6.2500 mg | INTRAMUSCULAR | Status: DC | PRN

## 2020-04-08 MED ORDER — MORPHINE SULFATE (PF) 0.5 MG/ML IJ SOLN
INTRAMUSCULAR | Status: DC | PRN
  Administered 2020-04-08: .15 mg via INTRATHECAL

## 2020-04-08 MED ORDER — NALBUPHINE HCL 10 MG/ML IJ SOLN: 5.0000 mg | Freq: Once | INTRAMUSCULAR | Status: DC | PRN

## 2020-04-08 MED ORDER — CEFAZOLIN SODIUM-DEXTROSE 2-4 GM/100ML-% IV SOLN
2.0000 g | INTRAVENOUS | Status: AC
  Administered 2020-04-08: 2 g via INTRAVENOUS
  Filled 2020-04-08: qty 100

## 2020-04-08 MED ORDER — STERILE WATER FOR IRRIGATION IR SOLN
Status: DC | PRN
  Administered 2020-04-08: 1000 mL

## 2020-04-08 MED ORDER — NALOXONE HCL 4 MG/10ML IJ SOLN: 1.0000 ug/kg/h | INTRAVENOUS | Status: DC | PRN

## 2020-04-08 MED ORDER — PHENYLEPHRINE HCL-NACL 20-0.9 MG/250ML-% IV SOLN
INTRAVENOUS | Status: AC
  Filled 2020-04-08: qty 250

## 2020-04-08 MED ORDER — BUPIVACAINE IN DEXTROSE 0.75-8.25 % IT SOLN
INTRATHECAL | Status: DC | PRN
  Administered 2020-04-08: 1.6 mL via INTRATHECAL

## 2020-04-08 MED ORDER — EPHEDRINE 5 MG/ML INJ
INTRAVENOUS | Status: AC
  Filled 2020-04-08: qty 10

## 2020-04-08 MED ORDER — HYDROMORPHONE HCL 1 MG/ML IJ SOLN: 0.2500 mg | INTRAMUSCULAR | Status: DC | PRN

## 2020-04-08 MED ORDER — NALOXONE HCL 0.4 MG/ML IJ SOLN: 0.4000 mg | INTRAMUSCULAR | Status: DC | PRN

## 2020-04-08 MED ORDER — FENTANYL CITRATE (PF) 100 MCG/2ML IJ SOLN
INTRAMUSCULAR | Status: AC
  Filled 2020-04-08: qty 2

## 2020-04-08 MED ORDER — DIPHENHYDRAMINE HCL 50 MG/ML IJ SOLN: 12.5000 mg | INTRAMUSCULAR | Status: DC | PRN

## 2020-04-08 MED ORDER — SCOPOLAMINE 1 MG/3DAYS TD PT72
1.0000 | MEDICATED_PATCH | Freq: Once | TRANSDERMAL | Status: DC
  Administered 2020-04-08: 1.5 mg via TRANSDERMAL

## 2020-04-08 MED ORDER — KETOROLAC TROMETHAMINE 30 MG/ML IJ SOLN
30.0000 mg | Freq: Four times a day (QID) | INTRAMUSCULAR | Status: AC | PRN
  Administered 2020-04-08: 30 mg via INTRAVENOUS

## 2020-04-08 MED ORDER — LACTATED RINGERS IV SOLN: INTRAVENOUS | Status: DC

## 2020-04-08 MED ORDER — ONDANSETRON HCL 4 MG/2ML IJ SOLN
INTRAMUSCULAR | Status: DC | PRN
  Administered 2020-04-08: 4 mg via INTRAVENOUS

## 2020-04-08 MED ORDER — SODIUM CHLORIDE 0.9 % IR SOLN
Status: DC | PRN
  Administered 2020-04-08: 1

## 2020-04-08 MED ORDER — EPHEDRINE SULFATE 50 MG/ML IJ SOLN
INTRAMUSCULAR | Status: DC | PRN
  Administered 2020-04-08: 5 mg via INTRAVENOUS

## 2020-04-08 MED ORDER — KETOROLAC TROMETHAMINE 30 MG/ML IJ SOLN: 30.0000 mg | Freq: Four times a day (QID) | INTRAMUSCULAR | Status: AC | PRN

## 2020-04-08 MED ORDER — SOD CITRATE-CITRIC ACID 500-334 MG/5ML PO SOLN
30.0000 mL | ORAL | Status: AC
  Administered 2020-04-08: 30 mL via ORAL
  Filled 2020-04-08: qty 30

## 2020-04-08 MED ORDER — ONDANSETRON HCL 4 MG/2ML IJ SOLN
INTRAMUSCULAR | Status: AC
  Filled 2020-04-08: qty 2

## 2020-04-08 MED ORDER — NALBUPHINE HCL 10 MG/ML IJ SOLN: 5.0000 mg | INTRAMUSCULAR | Status: DC | PRN

## 2020-04-08 MED ORDER — SODIUM CHLORIDE 0.9 % IV SOLN
INTRAVENOUS | Status: AC
  Filled 2020-04-08: qty 500

## 2020-04-08 SURGICAL SUPPLY — 37 items
APL SKNCLS STERI-STRIP NONHPOA (GAUZE/BANDAGES/DRESSINGS) ×1
BENZOIN TINCTURE PRP APPL 2/3 (GAUZE/BANDAGES/DRESSINGS) ×1 IMPLANT
CHLORAPREP W/TINT 26ML (MISCELLANEOUS) ×2 IMPLANT
CLAMP CORD UMBIL (MISCELLANEOUS) IMPLANT
CLOTH BEACON ORANGE TIMEOUT ST (SAFETY) ×2 IMPLANT
DRSG OPSITE POSTOP 4X10 (GAUZE/BANDAGES/DRESSINGS) ×2 IMPLANT
ELECT REM PT RETURN 9FT ADLT (ELECTROSURGICAL) ×2
ELECTRODE REM PT RTRN 9FT ADLT (ELECTROSURGICAL) ×1 IMPLANT
EXTRACTOR VACUUM M CUP 4 TUBE (SUCTIONS) IMPLANT
GLOVE BIOGEL PI IND STRL 6.5 (GLOVE) ×1 IMPLANT
GLOVE BIOGEL PI IND STRL 7.0 (GLOVE) ×1 IMPLANT
GLOVE BIOGEL PI INDICATOR 6.5 (GLOVE) ×1
GLOVE BIOGEL PI INDICATOR 7.0 (GLOVE) ×1
GLOVE ECLIPSE 6.5 STRL STRAW (GLOVE) ×2 IMPLANT
GOWN STRL REUS W/TWL LRG LVL3 (GOWN DISPOSABLE) ×4 IMPLANT
KIT ABG SYR 3ML LUER SLIP (SYRINGE) IMPLANT
NDL HYPO 25X5/8 SAFETYGLIDE (NEEDLE) IMPLANT
NEEDLE HYPO 25X5/8 SAFETYGLIDE (NEEDLE) IMPLANT
NS IRRIG 1000ML POUR BTL (IV SOLUTION) ×2 IMPLANT
PACK C SECTION WH (CUSTOM PROCEDURE TRAY) ×2 IMPLANT
PAD ABD 7.5X8 STRL (GAUZE/BANDAGES/DRESSINGS) ×2 IMPLANT
PAD OB MATERNITY 4.3X12.25 (PERSONAL CARE ITEMS) ×2 IMPLANT
PENCIL SMOKE EVAC W/HOLSTER (ELECTROSURGICAL) ×2 IMPLANT
RTRCTR C-SECT PINK 25CM LRG (MISCELLANEOUS) ×2 IMPLANT
SPONGE GAUZE 4X4 12PLY STER LF (GAUZE/BANDAGES/DRESSINGS) ×4 IMPLANT
STRIP CLOSURE SKIN 1/2X4 (GAUZE/BANDAGES/DRESSINGS) ×2 IMPLANT
SUT MNCRL 0 VIOLET CTX 36 (SUTURE) IMPLANT
SUT MON AB 2-0 CT1 27 (SUTURE) ×2 IMPLANT
SUT MONOCRYL 0 CTX 36 (SUTURE) ×2
SUT PDS AB 0 CTX 60 (SUTURE) IMPLANT
SUT PLAIN 2 0 XLH (SUTURE) IMPLANT
SUT VIC AB 0 CTX 36 (SUTURE) ×8
SUT VIC AB 0 CTX36XBRD ANBCTRL (SUTURE) ×4 IMPLANT
SUT VIC AB 4-0 KS 27 (SUTURE) ×2 IMPLANT
TOWEL OR 17X24 6PK STRL BLUE (TOWEL DISPOSABLE) ×2 IMPLANT
TRAY FOLEY W/BAG SLVR 14FR LF (SET/KITS/TRAYS/PACK) ×2 IMPLANT
WATER STERILE IRR 1000ML POUR (IV SOLUTION) ×2 IMPLANT

## 2020-04-08 NOTE — MAU Note (Signed)
Pt was dc'd from MAU this morning around 5am.  CTX have continued and gotten stronger, now 5 min apart.  Denies LOF, reports bloody show.  +Fetal movement.

## 2020-04-08 NOTE — Transfer of Care (Signed)
Immediate Anesthesia Transfer of Care Note  Patient: Brenda Welch  Procedure(s) Performed: CESAREAN SECTION (N/A )  Patient Location: PACU  Anesthesia Type:Spinal  Level of Consciousness: awake, alert  and oriented  Airway & Oxygen Therapy: Patient Spontanous Breathing  Post-op Assessment: Report given to RN and Post -op Vital signs reviewed and stable  Post vital signs: Reviewed and stable  Last Vitals:  Vitals Value Taken Time  BP 116/102 04/08/20 2304  Temp    Pulse 72 04/08/20 2306  Resp 14 04/08/20 2306  SpO2 100 % 04/08/20 2306  Vitals shown include unvalidated device data.  Last Pain:  Vitals:   04/08/20 1913  TempSrc:   PainSc: 10-Worst pain ever      Patients Stated Pain Goal: 2 (04/08/20 1913)  Complications: No complications documented.

## 2020-04-08 NOTE — MAU Provider Note (Signed)
First Provider Initiated Contact with Patient 04/08/20 0300     S: Ms. Brenda Welch is a 39 y.o. I6O0321 at [redacted]w[redacted]d  who presents to MAU today complaining contractions q 15 minutes. She endorses blody show. She denies LOF. She reports normal fetal movement.    O: BP 117/67 (BP Location: Right Arm)   Pulse 78   Temp 98.2 F (36.8 C) (Oral)   Resp 18   Ht 5\' 1"  (1.549 m)   Wt 94.2 kg   LMP 03/05/2019   BMI 39.24 kg/m  GENERAL: Well-developed, well-nourished female in no acute distress.  HEAD: Normocephalic, atraumatic.  CHEST: Normal effort of breathing, regular heart rate ABDOMEN: Soft, nontender, gravid  Cervical exam:  Dilation: Closed Effacement (%): Thick Cervical Position: Posterior Exam by:: [email or web address], RN   Fetal Monitoring: Baseline: 130 Variability: Mod Accelerations: 15 x 15 Decelerations: None Contractions: Rare   A: SIUP at [redacted]w[redacted]d  Cervix remains closed after two hours of monitoring in MAU Reactive tracing  P: Discharge home in stable condition  F/U: Patient's scheduled repeat cesarean is tomorrow 07/20  8/20, CNM 04/08/2020 6:04 AM

## 2020-04-08 NOTE — Anesthesia Preprocedure Evaluation (Signed)
Anesthesia Evaluation  Patient identified by MRN, date of birth, ID band Patient awake    Reviewed: Allergy & Precautions, H&P , NPO status , Patient's Chart, lab work & pertinent test results  Airway Mallampati: III       Dental no notable dental hx. (+) Teeth Intact   Pulmonary neg pulmonary ROS,    breath sounds clear to auscultation       Cardiovascular Exercise Tolerance: Good Normal cardiovascular exam Rhythm:regular Rate:Normal     Neuro/Psych negative neurological ROS  negative psych ROS   GI/Hepatic   Endo/Other  diabetes, Gestational, Oral Hypoglycemic AgentsHypothyroidism Morbid obesity  Renal/GU   negative genitourinary   Musculoskeletal negative musculoskeletal ROS (+)   Abdominal (+) + obese,   Peds  Hematology   Anesthesia Other Findings   Reproductive/Obstetrics (+) Pregnancy                             Anesthesia Physical  Anesthesia Plan  ASA: III  Anesthesia Plan: Spinal   Post-op Pain Management:    Induction:   PONV Risk Score and Plan: 3 and Ondansetron, Dexamethasone and Scopolamine patch - Pre-op  Airway Management Planned: Nasal Cannula and Natural Airway  Additional Equipment: None  Intra-op Plan:   Post-operative Plan:   Informed Consent: I have reviewed the patients History and Physical, chart, labs and discussed the procedure including the risks, benefits and alternatives for the proposed anesthesia with the patient or authorized representative who has indicated his/her understanding and acceptance.       Plan Discussed with: CRNA  Anesthesia Plan Comments:         Anesthesia Quick Evaluation

## 2020-04-08 NOTE — Op Note (Signed)
Procedure(s): CESAREAN SECTION Procedure Note  Marsena Taff female 39 y.o. 04/08/2020  Procedure(s) and Anesthesia Type:    * CESAREAN SECTION - Regional  Surgeon(s) and Role:    * Taam-Akelman, Griselda Miner, MD - Primary  Indications: [place] 39 y.o. F6O1308 [redacted]w[redacted]d admitted from MAU with spontaneous rupture of membranes, history of CS x2     Surgeon: Rande Brunt   Anesthesia: Spinal anesthesia   Procedure Detail  CESAREAN SECTION  Findings: Normal mullerian anatomy.  Viable female infant with weight 3430g (7pounds 9 ounces) Apgars 9 and 9. Placenta with accessory lobe. Minimal adhesive disease   Estimated Blood Loss:  372cc         Specimens: Placenta to L&D         Complications:  None         Disposition: PACU - hemodynamically stable.         Condition: stable    Description of Procedure: The patient was taken to the operating room where epidural anesthesia was found to be adequate.  The patient was placed in the dorsal supine position.  Fetal heart tones were confirmed.  The patient was subsequently prepped and draped in the normal sterile fashion.    A low transverse skin incision was made with a scalpel and carried down to the level of the fascia with the Bovie.  The fascia was incised in the midline with the scalpel and extended laterally with curved Mayo scissors.  Kocher clamps were applied to the superior fascial edge and the fascia was dissected off the rectus muscle sharply using the scalpel.  The Kocher clamps were transferred to the inferior fascial edge and the underlying rectus muscle was dissected off with curved Mayo's scissors.  The rectus muscles then were separated in the midline.  The peritoneum was found free of adherent bowel and the peritoneal cavity was entered bluntly.  The uterus was identified and the alexis retractor was placed intraperitoneal.  A low transverse hysterotomy was then made with a scalpel.  The infant was found  in the cephalic presentation was delivered atraumatically and without difficulty in the usual fashion.  After 60 seconds of delayed cord clamping the cord was clamped and cut and the infant was handed off to the pediatricians.  The placenta was delivered with gentle traction on umbilical cord and manual massage of the uterine fundus.  The uterus was cleared of all clot and debris.  The hysterotomy was then closed with 0 monocryl in a running locked fashion.  Followed by 0 Monocryl in an imbricating fashion.  The hysterotomy was found to be hemostatic.  The peritoneum was closed with Vicryl in a running fashion.  The fascia was closed with a loop PDS suture in a continuous running fashion.  The subcutaneous tissue was irrigated and rendered hemostatic with cautery.  The subcutaneous layer was subsequently closed with Vicryl in a continuous running fashion.  The skin was closed with 3-0 Monocryl in a running subcuticular fashion.  Sponge, lap and needle counts were correct. Steri strips, Honeycomb dressing and pressure dressing was placed on the incision.   Taeveon Keesling K Taam-Akelman 04/08/20 10:53 PM

## 2020-04-08 NOTE — Anesthesia Procedure Notes (Signed)
Spinal  Patient location during procedure: OR Start time: 04/08/2020 9:32 PM End time: 04/08/2020 9:37 PM Staffing Performed: anesthesiologist  Anesthesiologist: Leilani Able, MD Preanesthetic Checklist Completed: patient identified, IV checked, site marked, risks and benefits discussed, surgical consent, monitors and equipment checked, pre-op evaluation and timeout performed Spinal Block Patient position: sitting Prep: DuraPrep and site prepped and draped Patient monitoring: continuous pulse ox and blood pressure Approach: midline Location: L3-4 Injection technique: single-shot Needle Needle type: Pencan  Needle gauge: 24 G Needle length: 10 cm Needle insertion depth: 7 cm Assessment Sensory level: T4

## 2020-04-08 NOTE — H&P (Signed)
Brenda Welch is a 39 y.o. female [phone] [redacted]w[redacted]d presenting for labor evaluation and then SROM'ed in MAU. Originally presented to MAU with contractions and bloody show. Reports normal FM.   Pregnancy c/b 1. H/o CS x2 2. GDMA2: on glyburide 15mg  AM and 10mg  PM. EFW 58% 6/14. Last dose 7/19 AM.  3. Hypothyroidism: synthroid 137 mcg Mon-Sat and 274 mcg on Sun 4. AMA: panorama low risk. Normal anatomy 5. Overweight prepregnancy BMI 28.2, current BMI 39.4  OB History    Gravida  7   Para  2   Term  2   Preterm      AB  4   Living  2     SAB  2   TAB  2   Ectopic      Multiple      Live Births  2          Past Medical History:  Diagnosis Date  . Gestational diabetes   . Hashimoto's disease   . Hypothyroidism    Past Surgical History:  Procedure Laterality Date  . CESAREAN SECTION    . CESAREAN SECTION N/A 09/26/2013   Procedure: REPEAT CESAREAN SECTION;  Surgeon: Tue. 11/24/2013, MD;  Location: [facility];  Service: Obstetrics;  Laterality: N/A;  . HERNIA REPAIR    . UMBILICAL HERNIA REPAIR     Family History: family history includes Atrial fibrillation in her mother; Diabetes in her father; Hypertension in her father and mother. Social History:  reports that she has never smoked. She has never used smokeless tobacco. She reports that she does not drink alcohol and does not use drugs.     Maternal Diabetes: Yes:  Diabetes Type:  Insulin/Medication controlled Genetic Screening: Normal - panorama NIPT low risk Maternal Ultrasounds/Referrals: Normal - left lateral placenta Fetal Ultrasounds or other Referrals:  None Maternal Substance Abuse:  No Significant Maternal Medications:  Meds include: Syntroid Significant Maternal Lab Results:  Group B Strep negative Other Comments:  None  Review of Systems Per HPI Exam Physical Exam  Dilation: Fingertip (externally 0 internal) Effacement (%): 70 Exam by:: Lauren Cox RN  Blood pressure 134/75, pulse 83, temperature  98.3 F (36.8 C), temperature source Oral, resp. rate 18, last menstrual period 03/05/2019, SpO2 100 %, unknown if currently breastfeeding. NAD, resting comfortably, painful with contractions Gravid abdomen Fetal testing: FHR 145, Cat 1 Toco q3m Prenatal labs: ABO, Rh:  --/--/O POS (07/18 0818) Antibody: NEG (07/18 0818) Rubella: Immune (12/02 0000) RPR: NON REACTIVE (07/18 0818)  HBsAg: Negative (12/02 0000)  HIV: Non-reactive (12/02 0000)  GBS: Negative/-- (06/25 0000)   Recent Labs    04/07/20 0818  WBC 6.2  HGB 11.4*  HCT 35.6*  PLT 202   Assessment/Plan: Brenda Welch 38 y.o. 04/09/20 at [redacted]w[redacted]d with SROM, proceed with repeat CS 1. C-Section: h/o CS x2, plan to proceed with CS, reviewed consent, discussed risks including infection, bleeding, blood transfusion, damage to surrounding structures. Agrees with plan to proceed 2. GDMA2: check BG 3. Hypothyroidism: synthroid 137 mcg Mon-Sat and 274 mcg on Sun 4. Obesity: prepregnancy BMI 28.2, current BMI 39.4  Brenda Welch 04/08/2020, 8:16 PM

## 2020-04-08 NOTE — Brief Op Note (Signed)
04/08/2020  10:52 PM  PATIENT:  Brenda Welch  40 y.o. female  PRE-OPERATIVE DIAGNOSIS:  repeat cesarean section   POST-OPERATIVE DIAGNOSIS:  repeat cesarean section   PROCEDURE:  Procedure(s): CESAREAN SECTION (N/A)  SURGEON:  Surgeon(s) and Role:    * Taam-Akelman, Griselda Miner, MD - Primary   ANESTHESIA:   spinal  EBL: pending anesthesia  BLOOD ADMINISTERED:none  DRAINS: none   LOCAL MEDICATIONS USED:  NONE  SPECIMEN: Placenta to L&D  DISPOSITION OF SPECIMEN:  L&D  COUNTS:  YES  TOURNIQUET:  * No tourniquets in log *  DICTATION: .Note written in EPIC  PLAN OF CARE: Admit to inpatient   PATIENT DISPOSITION:  PACU - hemodynamically stable.   Delay start of Pharmacological VTE agent (>24hrs) due to surgical blood loss or risk of bleeding: not applicable

## 2020-04-08 NOTE — Discharge Instructions (Signed)
Braxton Hicks Contractions °Contractions of the uterus can occur throughout pregnancy, but they are not always a sign that you are in labor. You may have practice contractions called Braxton Hicks contractions. These false labor contractions are sometimes confused with true labor. °What are Braxton Hicks contractions? °Braxton Hicks contractions are tightening movements that occur in the muscles of the uterus before labor. Unlike true labor contractions, these contractions do not result in opening (dilation) and thinning of the cervix. Toward the end of pregnancy (32-34 weeks), Braxton Hicks contractions can happen more often and may become stronger. These contractions are sometimes difficult to tell apart from true labor because they can be very uncomfortable. You should not feel embarrassed if you go to the hospital with false labor. °Sometimes, the only way to tell if you are in true labor is for your health care provider to look for changes in the cervix. The health care provider will do a physical exam and may monitor your contractions. If you are not in true labor, the exam should show that your cervix is not dilating and your water has not broken. °If there are no other health problems associated with your pregnancy, it is completely safe for you to be sent home with false labor. You may continue to have Braxton Hicks contractions until you go into true labor. °How to tell the difference between true labor and false labor °True labor °· Contractions last 30-70 seconds. °· Contractions become very regular. °· Discomfort is usually felt in the top of the uterus, and it spreads to the lower abdomen and low back. °· Contractions do not go away with walking. °· Contractions usually become more intense and increase in frequency. °· The cervix dilates and gets thinner. °False labor °· Contractions are usually shorter and not as strong as true labor contractions. °· Contractions are usually irregular. °· Contractions  are often felt in the front of the lower abdomen and in the groin. °· Contractions may go away when you walk around or change positions while lying down. °· Contractions get weaker and are shorter-lasting as time goes on. °· The cervix usually does not dilate or become thin. °Follow these instructions at home: ° °· Take over-the-counter and prescription medicines only as told by your health care provider. °· Keep up with your usual exercises and follow other instructions from your health care provider. °· Eat and drink lightly if you think you are going into labor. °· If Braxton Hicks contractions are making you uncomfortable: °? Change your position from lying down or resting to walking, or change from walking to resting. °? Sit and rest in a tub of warm water. °? Drink enough fluid to keep your urine pale yellow. Dehydration may cause these contractions. °? Do slow and deep breathing several times an hour. °· Keep all follow-up prenatal visits as told by your health care provider. This is important. °Contact a health care provider if: °· You have a fever. °· You have continuous pain in your abdomen. °Get help right away if: °· Your contractions become stronger, more regular, and closer together. °· You have fluid leaking or gushing from your vagina. °· You pass blood-tinged mucus (bloody show). °· You have bleeding from your vagina. °· You have low back pain that you never had before. °· You feel your baby’s head pushing down and causing pelvic pressure. °· Your baby is not moving inside you as much as it used to. °Summary °· Contractions that occur before labor are   called Braxton Hicks contractions, false labor, or practice contractions. °· Braxton Hicks contractions are usually shorter, weaker, farther apart, and less regular than true labor contractions. True labor contractions usually become progressively stronger and regular, and they become more frequent. °· Manage discomfort from Braxton Hicks contractions  by changing position, resting in a warm bath, drinking plenty of water, or practicing deep breathing. °This information is not intended to replace advice given to you by your health care provider. Make sure you discuss any questions you have with your health care provider. °Document Revised: 08/20/2017 Document Reviewed: 01/21/2017 °Elsevier Patient Education © 2020 [employer] ° °

## 2020-04-08 NOTE — MAU Note (Signed)
Pt reports to MAU c/o ctx every 15 min. Pt reports she is also having vaginal bleeding that was pink but is now dark red in apperance pt has on a panty liner but also sees the bleeding in the toilet. Pt reports +FM.

## 2020-04-08 NOTE — MAU Note (Signed)
Dr. Claudine Mouton notified @ 2000.  OB OR charge nurse notified @ 2004.  [facility] notified @ 2005. OB Anesthesiologist Notified @ 2007.  MB charge nurse notified @ 2008.

## 2020-04-09 ENCOUNTER — Encounter (HOSPITAL_COMMUNITY): Payer: Self-pay | Admitting: Obstetrics & Gynecology

## 2020-04-09 DIAGNOSIS — Z98891 History of uterine scar from previous surgery: Secondary | ICD-10-CM

## 2020-04-09 LAB — CBC
HCT: 32.6 % — ABNORMAL LOW (ref 36.0–46.0)
Hemoglobin: 10.4 g/dL — ABNORMAL LOW (ref 12.0–15.0)
MCH: 27.5 pg (ref 26.0–34.0)
MCHC: 31.9 g/dL (ref 30.0–36.0)
MCV: 86.2 fL (ref 80.0–100.0)
Platelets: 185 10*3/uL (ref 150–400)
RBC: 3.78 MIL/uL — ABNORMAL LOW (ref 3.87–5.11)
RDW: 15.8 % — ABNORMAL HIGH (ref 11.5–15.5)
WBC: 10 10*3/uL (ref 4.0–10.5)
nRBC: 0 % (ref 0.0–0.2)

## 2020-04-09 LAB — RPR: RPR Ser Ql: NONREACTIVE

## 2020-04-09 LAB — GLUCOSE, CAPILLARY
Glucose-Capillary: 123 mg/dL — ABNORMAL HIGH (ref 70–99)
Glucose-Capillary: 113 mg/dL — ABNORMAL HIGH (ref 70–99)
Glucose-Capillary: 97 mg/dL (ref 70–99)

## 2020-04-09 MED ORDER — MIDAZOLAM HCL 2 MG/2ML IJ SOLN
INTRAMUSCULAR | Status: AC
  Filled 2020-04-09: qty 2

## 2020-04-09 MED ORDER — SODIUM CHLORIDE (PF) 0.9 % IJ SOLN
INTRAMUSCULAR | Status: AC
  Filled 2020-04-09: qty 10

## 2020-04-09 MED ORDER — MENTHOL 3 MG MT LOZG: 1.0000 | LOZENGE | OROMUCOSAL | Status: DC | PRN

## 2020-04-09 MED ORDER — DIBUCAINE (PERIANAL) 1 % EX OINT: 1.0000 "application " | TOPICAL_OINTMENT | CUTANEOUS | Status: DC | PRN

## 2020-04-09 MED ORDER — WITCH HAZEL-GLYCERIN EX PADS: 1.0000 "application " | MEDICATED_PAD | CUTANEOUS | Status: DC | PRN

## 2020-04-09 MED ORDER — OXYTOCIN-SODIUM CHLORIDE 30-0.9 UT/500ML-% IV SOLN: 2.5000 [IU]/h | INTRAVENOUS | Status: AC

## 2020-04-09 MED ORDER — ZOLPIDEM TARTRATE 5 MG PO TABS: 5.0000 mg | ORAL_TABLET | Freq: Every evening | ORAL | Status: DC | PRN

## 2020-04-09 MED ORDER — SIMETHICONE 80 MG PO CHEW: 80.0000 mg | CHEWABLE_TABLET | ORAL | Status: DC | PRN

## 2020-04-09 MED ORDER — ONDANSETRON HCL 4 MG/2ML IJ SOLN
INTRAMUSCULAR | Status: AC
  Filled 2020-04-09: qty 2

## 2020-04-09 MED ORDER — LIDOCAINE 2% (20 MG/ML) 5 ML SYRINGE
INTRAMUSCULAR | Status: AC
  Filled 2020-04-09: qty 5

## 2020-04-09 MED ORDER — LACTATED RINGERS IV SOLN: INTRAVENOUS | Status: DC

## 2020-04-09 MED ORDER — SENNOSIDES-DOCUSATE SODIUM 8.6-50 MG PO TABS
2.0000 | ORAL_TABLET | ORAL | Status: DC
  Administered 2020-04-09: 2 via ORAL
  Filled 2020-04-09: qty 2

## 2020-04-09 MED ORDER — PHENYLEPHRINE 40 MCG/ML (10ML) SYRINGE FOR IV PUSH (FOR BLOOD PRESSURE SUPPORT)
PREFILLED_SYRINGE | INTRAVENOUS | Status: AC
  Filled 2020-04-09: qty 10

## 2020-04-09 MED ORDER — NEOSTIGMINE METHYLSULFATE 3 MG/3ML IV SOSY
PREFILLED_SYRINGE | INTRAVENOUS | Status: AC
  Filled 2020-04-09: qty 3

## 2020-04-09 MED ORDER — SUCCINYLCHOLINE CHLORIDE 200 MG/10ML IV SOSY
PREFILLED_SYRINGE | INTRAVENOUS | Status: AC
  Filled 2020-04-09: qty 10

## 2020-04-09 MED ORDER — SIMETHICONE 80 MG PO CHEW
80.0000 mg | CHEWABLE_TABLET | ORAL | Status: DC
  Filled 2020-04-09 (×2): qty 1

## 2020-04-09 MED ORDER — EPHEDRINE 5 MG/ML INJ
INTRAVENOUS | Status: AC
  Filled 2020-04-09: qty 10

## 2020-04-09 MED ORDER — PRENATAL MULTIVITAMIN CH
1.0000 | ORAL_TABLET | Freq: Every day | ORAL | Status: DC
  Administered 2020-04-09 – 2020-04-10 (×2): 1 via ORAL
  Filled 2020-04-09 (×2): qty 1

## 2020-04-09 MED ORDER — FENTANYL CITRATE (PF) 250 MCG/5ML IJ SOLN
INTRAMUSCULAR | Status: AC
  Filled 2020-04-09: qty 5

## 2020-04-09 MED ORDER — ACETAMINOPHEN 325 MG PO TABS
650.0000 mg | ORAL_TABLET | ORAL | Status: DC | PRN
  Administered 2020-04-10: 650 mg via ORAL
  Filled 2020-04-09: qty 2

## 2020-04-09 MED ORDER — MORPHINE SULFATE (PF) 0.5 MG/ML IJ SOLN
INTRAMUSCULAR | Status: AC
  Filled 2020-04-09: qty 10

## 2020-04-09 MED ORDER — KETOROLAC TROMETHAMINE 30 MG/ML IJ SOLN
30.0000 mg | Freq: Four times a day (QID) | INTRAMUSCULAR | Status: AC
  Filled 2020-04-09: qty 1

## 2020-04-09 MED ORDER — SIMETHICONE 80 MG PO CHEW
80.0000 mg | CHEWABLE_TABLET | Freq: Three times a day (TID) | ORAL | Status: DC
  Administered 2020-04-09 – 2020-04-10 (×5): 80 mg via ORAL
  Filled 2020-04-09 (×3): qty 1

## 2020-04-09 MED ORDER — IBUPROFEN 800 MG PO TABS
800.0000 mg | ORAL_TABLET | Freq: Four times a day (QID) | ORAL | Status: DC
  Administered 2020-04-09 – 2020-04-10 (×4): 800 mg via ORAL
  Filled 2020-04-09 (×4): qty 1

## 2020-04-09 MED ORDER — OXYCODONE HCL 5 MG PO TABS: 5.0000 mg | ORAL_TABLET | ORAL | Status: DC | PRN

## 2020-04-09 MED ORDER — PROPOFOL 10 MG/ML IV BOLUS
INTRAVENOUS | Status: AC
  Filled 2020-04-09: qty 20

## 2020-04-09 MED ORDER — ROCURONIUM BROMIDE 10 MG/ML (PF) SYRINGE
PREFILLED_SYRINGE | INTRAVENOUS | Status: AC
  Filled 2020-04-09: qty 10

## 2020-04-09 MED ORDER — DIPHENHYDRAMINE HCL 25 MG PO CAPS: 25.0000 mg | ORAL_CAPSULE | Freq: Four times a day (QID) | ORAL | Status: DC | PRN

## 2020-04-09 MED ORDER — COCONUT OIL OIL: 1.0000 "application " | TOPICAL_OIL | Status: DC | PRN

## 2020-04-09 MED ORDER — OXYTOCIN-SODIUM CHLORIDE 30-0.9 UT/500ML-% IV SOLN
INTRAVENOUS | Status: AC
  Filled 2020-04-09: qty 500

## 2020-04-09 MED ORDER — FENTANYL CITRATE (PF) 100 MCG/2ML IJ SOLN
INTRAMUSCULAR | Status: AC
  Filled 2020-04-09: qty 2

## 2020-04-09 MED ORDER — SIMETHICONE 80 MG PO CHEW: 80.0000 mg | CHEWABLE_TABLET | ORAL | Status: DC

## 2020-04-09 MED ORDER — GLYCOPYRROLATE PF 0.2 MG/ML IJ SOSY
PREFILLED_SYRINGE | INTRAMUSCULAR | Status: AC
  Filled 2020-04-09: qty 1

## 2020-04-09 MED ORDER — ARTIFICIAL TEARS OPHTHALMIC OINT
TOPICAL_OINTMENT | OPHTHALMIC | Status: AC
  Filled 2020-04-09: qty 3.5

## 2020-04-09 NOTE — Anesthesia Postprocedure Evaluation (Signed)
Anesthesia Post Note  Patient: Brenda Welch  Procedure(s) Performed: CESAREAN SECTION (N/A )     Patient location during evaluation: PACU Anesthesia Type: Spinal Level of consciousness: awake Pain management: pain level controlled Vital Signs Assessment: post-procedure vital signs reviewed and stable Respiratory status: spontaneous breathing Cardiovascular status: stable Postop Assessment: no headache, no backache, spinal receding, patient able to bend at knees and no apparent nausea or vomiting Anesthetic complications: no   No complications documented.  Last Vitals:  Vitals:   04/09/20 0220 04/09/20 0300  BP: (!) 115/54   Pulse: 73   Resp: 18 16  Temp: 36.7 C 36.9 C  SpO2: 97%     Last Pain:  Vitals:   04/09/20 0320  TempSrc:   PainSc: 0-No pain   Pain Goal: Patients Stated Pain Goal: 2 (04/08/20 1913)                 Caren Macadam

## 2020-04-09 NOTE — Lactation Note (Signed)
This note was copied from a baby's chart. Lactation Consultation Note  Patient Name: Girl Lessie Funderburke MOQHU'T Date: 04/09/2020  P3, 18 hour term female infant -1% weight loss. Per mom, she feels infant latches well but she falls asleep quickly after 5 to 7 minutes. LC did not observe latch at this time, mom was doing STS infant was asleep on mom's chest. Per mom. Infant breastfed for 5 minutes each twice within 1 hour 3:15 to 3:45 . LC discussed tips to keep infant awake and stimulated to breastfeed for longer duration. Mom will start latching infant STS, talk to infant while breastfeeding, stroke infant's neck and shoulder, mom can also burp baby and replace back on the breast. Mom's goal is to increase feedings from 5 minutes to 10 to 20 minutes most feedings. Mom also plans to breastfeed infant and after wards hand express and give extra EBM on spoon.  Mom has no other questions or concerns at this time. Mom understands to call RN or LC if she needs assistance with latch.   Maternal Data    Feeding Feeding Type: Breast Fed  LATCH Score Latch: Grasps breast easily, tongue down, lips flanged, rhythmical sucking.  Audible Swallowing: A few with stimulation  Type of Nipple: Everted at rest and after stimulation  Comfort (Breast/Nipple): Soft / non-tender  Hold (Positioning): Assistance needed to correctly position infant at breast and maintain latch.  LATCH Score: 8  Interventions    Lactation Tools Discussed/Used     Consult Status      Danelle Earthly 04/09/2020, 4:41 PM

## 2020-04-09 NOTE — Progress Notes (Signed)
POD# 1 Pt without complaints. Lochia mild VSSAF Hgb- 10.4 IMP/ Doing well Plan/ Remove cath           [employer]

## 2020-04-09 NOTE — Progress Notes (Signed)
Patient did not call nurse to check blood sugar before eating lunch. Nurse reminded patient to call out before eating dinner.

## 2020-04-09 NOTE — Lactation Note (Addendum)
This note was copied from a baby's chart. Lactation Consultation Note  Patient Name: Brenda Welch OEVOJ'J Date: 04/09/2020 Reason for consult: Initial assessment;Term P3 , 3 hour term female infant. Mom's hx: AMA, GDM and hypothyroidism on Glyburide. Per mom, infant did not latch in recovery. Mom has DEBP at home, she BF 1st child for 2 months and 2nd child who is 6 years for 6 months. Tools given: mom was given hand pump to pre-pump breast prior to latching infant. Mom pre-pumped and expressed a small amount of colostrum out prior to latching infant, infant latched with a  wide mouth top lip flanged out, swallows observed, infant BF for 10 minutes and mom hand expressed 7 mls that was spoon fed to infant. Mom knows to BF infant according to cues, 8 to 12+ times within 24 hours. Parents will do STS with infant as much as possible.  Mom knows to call RN or LC if she needs assistance with latching infant at breast. Mom made aware of O/P services, breastfeeding support groups, community resources, and our phone # for post-discharge questions.   Maternal Data Formula Feeding for Exclusion: No Has patient been taught Hand Expression?: Yes Does the patient have breastfeeding experience prior to this delivery?: Yes  Feeding Feeding Type: Breast Fed  LATCH Score Latch: Grasps breast easily, tongue down, lips flanged, rhythmical sucking.  Audible Swallowing: A few with stimulation  Type of Nipple: Everted at rest and after stimulation (short shafted)  Comfort (Breast/Nipple): Soft / non-tender  Hold (Positioning): Assistance needed to correctly position infant at breast and maintain latch.  LATCH Score: 8  Interventions Interventions: Breast feeding basics reviewed;Assisted with latch;Breast compression;Skin to skin;Adjust position;Breast massage;Support pillows;Hand express;Position options;Pre-pump if needed;Expressed milk;Hand pump  Lactation Tools Discussed/Used WIC  Program: No Pump Review: Setup, frequency, and cleaning;Milk Storage Initiated by:: Danelle Earthly, IBCLC Date initiated:: 04/09/20   Consult Status Consult Status: Follow-up Date: 04/09/20 Follow-up type: In-patient    Danelle Earthly 04/09/2020, 1:25 AM

## 2020-04-09 NOTE — Lactation Note (Signed)
This note was copied from a baby's chart. Lactation Consultation Note  Patient Name: Girl Haelee Bolen KZLDJ'T Date: 04/09/2020 Reason for consult: Follow-up assessment;Term;Infant weight loss  Baby is 20 hours old  As LC entered the room mom working on latched and the baby fully dressed.  Mom mentioned the baby would latch and fall asleep.  LC offered to check the diaper and undress the baby and mom receptive.  LC assessed breast tissue and noted the nipples to be short shaft and the areola edema.  Mom was given a hand pump last night and LC reminded her prior to latch - breast massage,  Hand express, pre-pump to make the nipple / areola complex more elastic for a deeper latch.  LC noted when the baby is fussing or crying the baby lifts her tongue upward.  LC assessed with a gloved finger and noted the baby has a high palate and a very tight suck ,  Tongue does stay down.  LC worked with tongue exercises to  To help the baby to open wider to latch and when latching  Eased the chin and the few swallows noted and the baby would release.  Mom mentioned the baby has had a few feedings where the baby has been able to sustain  A latch and it was similar to the hand pump pressure.  Baby did not seemed focused to feed and LC placed baby STS on moms chest and recommended to  Try later to give the baby a break.  LC recommended this evening if the baby is still falling asleep when latched or its on and off to call for [facility] feeding assessment and may need to a Nipple Shield to enhance the baby to open wider for stimulation.  LC instructed mom on the use of shells between feedings except when sleeping and prior to every feeding - breast massage, hand express, pre-pump and latch firm support.    Maternal Data Has patient been taught Hand Expression?: Yes  Feeding Feeding Type: Breast Fed  LATCH Score Latch: Repeated attempts needed to sustain latch, nipple held in mouth throughout feeding,  stimulation needed to elicit sucking reflex.  Audible Swallowing: A few with stimulation  Type of Nipple: Everted at rest and after stimulation (areolo edema - indicating shells)  Comfort (Breast/Nipple): Soft / non-tender  Hold (Positioning): Assistance needed to correctly position infant at breast and maintain latch.  LATCH Score: 7  Interventions Interventions: Breast feeding basics reviewed;Assisted with latch;Skin to skin;Breast massage;Hand express;Reverse pressure;Breast compression;Adjust position;Support pillows;Position options  Lactation Tools Discussed/Used Tools: Shells;Pump;Flanges Flange Size: 24 Shell Type: Inverted Breast pump type: Manual   Consult Status Consult Status: Follow-up Date: 04/10/20 Follow-up type: In-patient    Matilde Sprang Eternity Dexter 04/09/2020, 6:33 PM

## 2020-04-09 NOTE — Progress Notes (Signed)
Pt IV came out. I called attending who said if her bleeding is fine, output is fine and tolerating fluids then she does not need another put in.

## 2020-04-09 NOTE — Progress Notes (Signed)
Went in room to give pt medication and pt states she vomited x2. I instructed pt to inform us if she keeps having N/V or cant keep fluids down. I will continue to monitor for indication for IV restart and notify MD if necessary.

## 2020-04-10 LAB — GLUCOSE, CAPILLARY: Glucose-Capillary: 90 mg/dL (ref 70–99)

## 2020-04-10 MED ORDER — IBUPROFEN 800 MG PO TABS: 800.0000 mg | ORAL_TABLET | Freq: Four times a day (QID) | ORAL | 0 refills | Status: DC

## 2020-04-10 MED ORDER — OXYCODONE HCL 5 MG PO TABS: 5.0000 mg | ORAL_TABLET | Freq: Four times a day (QID) | ORAL | 0 refills | Status: DC | PRN

## 2020-04-10 NOTE — Progress Notes (Signed)
Patient is doing well.  She is tolerating PO, ambulating, voiding.  Pain is controlled.  Lochia is appropriate  Vitals:   04/09/20 1117 04/09/20 1300 04/09/20 2115 04/10/20 0527  BP: (!) 105/52 (!) 101/47 (!) 116/57 (!) 112/49  Pulse: 64 63 (!) 57 61  Resp: 17  18 18   Temp: 98 F (36.7 C) 98.6 F (37 C) 97.9 F (36.6 C) 98.5 F (36.9 C)  TempSrc: Oral Oral Oral Oral  SpO2: 99% 100%  98%    NAD Abdomen:  soft, appropriate tenderness, incisions intact and without erythema or drainage ext:    Symmetric, trace edema bilaterally  Lab Results  Component Value Date   WBC 10.0 04/09/2020   HGB 10.4 (L) 04/09/2020   HCT 32.6 (L) 04/09/2020   MCV 86.2 04/09/2020   PLT 185 04/09/2020    --/--/O POS (07/18 0818)/RImmune  A/P    38 y.o. 11-30-1987 POD 2 s/p RCS Routine post op and postpartum care.   GDMA2--fastings normal.  2hr gtt 6-12wks PP

## 2020-04-10 NOTE — Discharge Summary (Signed)
Postpartum Discharge Summary      Patient Name: Brenda Welch DOB: [birth year] MRN: [id number]  Date of admission: 04/08/2020 Delivery date:04/08/2020  Delivering provider: Janey Greaser K  Date of discharge: 04/10/2020  Admitting diagnosis: Status post C-section [Z98.891] Intrauterine pregnancy: [redacted]w[redacted]d     Secondary diagnosis:  Active Problems:   Status post C-section  Additional problems: GDMA2, hypothyroidism    Discharge diagnosis: Term Pregnancy Delivered                                              Augmentation: N/A Complications: None  Hospital course: Sceduled C/S   39 y.o. yo [id number] at [redacted]w[redacted]d was admitted to the hospital 04/08/2020 for ROM and repeat cesarean section with the following indication:Elective Repeat.Delivery details are as follows:  Membrane Rupture Time/Date: 7:35 PM ,04/08/2020   Delivery Method:C-Section, Low Transverse  Details of operation can be found in separate operative note.  Patient had an uncomplicated postpartum course.  She is ambulating, tolerating a regular diet, passing flatus, and urinating well. Patient is discharged home in stable condition on  04/10/20        Newborn Data: Birth date:04/08/2020  Birth time:10:04 PM  Gender:Female  Living status:Living  Apgars:9 ,9  Weight:3430 g      Physical exam  Vitals:   04/09/20 1117 04/09/20 1300 04/09/20 2115 04/10/20 0527  BP: (!) 105/52 (!) 101/47 (!) 116/57 (!) 112/49  Pulse: 64 63 (!) 57 61  Resp: 17  18 18   Temp: 98 F (36.7 C) 98.6 F (37 C) 97.9 F (36.6 C) 98.5 F (36.9 C)  TempSrc: Oral Oral Oral Oral  SpO2: 99% 100%  98%   General: alert Lochia: appropriate Uterine Fundus: firm Incision: Healing well with no significant drainage, No significant erythema, Dressing is clean, dry, and intact DVT Evaluation: No evidence of DVT seen on physical exam. Labs: Lab Results  Component Value Date   WBC 10.0 04/09/2020   HGB 10.4 (L) 04/09/2020   HCT 32.6 (L) 04/09/2020    MCV 86.2 04/09/2020   PLT 185 04/09/2020   CMP Latest Ref Rng & Units 05/02/2019  Glucose 70 - 99 mg/dL 98  BUN 6 - 20 mg/dL 8  Creatinine 07/02/2019 - 5.42 mg/dL 7.06  Sodium 2.37 - 628 mmol/L 139  Potassium 3.5 - 5.1 mmol/L 3.7  Chloride 98 - 111 mmol/L 106  CO2 22 - 32 mmol/L 25  Calcium 8.9 - 10.3 mg/dL 9.1  Total Protein 6.5 - 8.1 g/dL 7.2  Total Bilirubin 0.3 - 1.2 mg/dL 0.6  Alkaline Phos 38 - 126 U/L 37(L)  AST 15 - 41 U/L 19  ALT 0 - 44 U/L 18   Edinburgh Score: Edinburgh Postnatal Depression Scale Screening Tool 04/09/2020  I have been able to laugh and see the funny side of things. 0  I have looked forward with enjoyment to things. 0  I have blamed myself unnecessarily when things went wrong. 0  I have been anxious or worried for no good reason. 0  I have felt scared or panicky for no good reason. 0  Things have been getting on top of me. 0  I have been so unhappy that I have had difficulty sleeping. 0  I have felt sad or miserable. 0  I have been so unhappy that I have been crying. 0  The thought of  harming myself has occurred to me. 0  Edinburgh Postnatal Depression Scale Total 0      After visit meds:  Allergies as of 04/10/2020   No Known Allergies     Medication List    STOP taking these medications   aspirin EC 81 MG tablet   glyBURIDE 5 MG tablet Commonly known as: DIABETA     TAKE these medications   ibuprofen 800 MG tablet Commonly known as: ADVIL Take 1 tablet (800 mg total) by mouth every 6 (six) hours.   levothyroxine 137 MCG tablet Commonly known as: SYNTHROID Take 137-274 mcg by mouth See admin instructions. 274 mcg on Sunday, all other days 137 mcg daily   oxyCODONE 5 MG immediate release tablet Commonly known as: Oxy IR/ROXICODONE Take 1-2 tablets (5-10 mg total) by mouth every 6 (six) hours as needed for moderate pain.   prenatal multivitamin Tabs tablet Take 1 tablet by mouth daily at 12 noon.            Discharge Care  Instructions  (From admission, onward)         Start     Ordered   04/10/20 0000  No dressing needed        07 /21/21 0919           Discharge home in stable condition  Infant Disposition:home with mother Discharge instruction: per After Visit Summary and Postpartum booklet. Activity: Advance as tolerated. Pelvic rest for 6 weeks.  Diet: routine diet  Postpartum Appointment:4 weeks Additional Postpartum F/U: 2 hour GTT Future Appointments:No future appointments. Follow up Visit:  Follow-up Information    Taam-Akelman, 08-28-1987, MD Follow up in 4 week(s).   Specialty: Obstetrics and Gynecology Contact information: [address] [place] [address] [place] [phone]                   04/10/2020 [facility], MD

## 2020-04-10 NOTE — Lactation Note (Signed)
This note was copied from a baby's chart. Lactation Consultation Note  Patient Name: Brenda Welch [name] Date: 04/10/2020 Reason for consult: Follow-up assessment;Term;Other (Comment) (5 % weight loss) Baby is 37 hours old  And for D/C today. Per mom the latching is much better and the shells are  Helping.  LC reviewed sore nipple and engorgement prevention and tx.  Mom has a hand pump and shells for D/C.  Per mom has a DEBP at home.  LC provided the [facility] pamphlet with phone numbers.   Maternal Data    Feeding Feeding Type: Breast Fed  LATCH Score                   Interventions Interventions: Breast feeding basics reviewed  Lactation Tools Discussed/Used Tools: Shells;Pump Shell Type: Inverted Breast pump type: Manual   Consult Status Consult Status: Complete Date: 04/10/20    Kathrin Greathouse 04/10/2020, 11:57 AM

## 2020-05-15 ENCOUNTER — Encounter (HOSPITAL_COMMUNITY): Payer: Self-pay | Admitting: Obstetrics & Gynecology

## 2021-06-24 ENCOUNTER — Other Ambulatory Visit: Payer: Self-pay | Admitting: Orthopedic Surgery

## 2021-07-31 ENCOUNTER — Encounter (HOSPITAL_BASED_OUTPATIENT_CLINIC_OR_DEPARTMENT_OTHER): Payer: Self-pay

## 2021-07-31 ENCOUNTER — Ambulatory Visit (HOSPITAL_BASED_OUTPATIENT_CLINIC_OR_DEPARTMENT_OTHER): Admit: 2021-07-31 | Payer: 59 | Admitting: Orthopedic Surgery

## 2021-07-31 SURGERY — ULNAR NERVE DECOMPRESSION/TRANSPOSITION
Anesthesia: Choice | Laterality: Left

## 2021-09-03 ENCOUNTER — Other Ambulatory Visit: Payer: Self-pay | Admitting: Orthopedic Surgery

## 2021-09-09 ENCOUNTER — Encounter (HOSPITAL_BASED_OUTPATIENT_CLINIC_OR_DEPARTMENT_OTHER): Payer: Self-pay | Admitting: Orthopedic Surgery

## 2021-09-09 ENCOUNTER — Other Ambulatory Visit: Payer: Self-pay

## 2021-09-19 ENCOUNTER — Encounter (HOSPITAL_BASED_OUTPATIENT_CLINIC_OR_DEPARTMENT_OTHER)
Admission: RE | Disposition: A | Discharge status: Home / Self Care | Payer: Self-pay | Source: Home / Self Care | Attending: Orthopedic Surgery

## 2021-09-19 ENCOUNTER — Ambulatory Visit (HOSPITAL_BASED_OUTPATIENT_CLINIC_OR_DEPARTMENT_OTHER): Payer: 59 | Admitting: Certified Registered"

## 2021-09-19 ENCOUNTER — Other Ambulatory Visit: Payer: Self-pay

## 2021-09-19 ENCOUNTER — Encounter (HOSPITAL_BASED_OUTPATIENT_CLINIC_OR_DEPARTMENT_OTHER): Payer: Self-pay | Admitting: Orthopedic Surgery

## 2021-09-19 ENCOUNTER — Ambulatory Visit (HOSPITAL_BASED_OUTPATIENT_CLINIC_OR_DEPARTMENT_OTHER)
Admission: RE | Admit: 2021-09-19 | Discharge: 2021-09-19 | Disposition: A | Discharge status: Home / Self Care | Payer: 59 | Attending: Orthopedic Surgery | Admitting: Orthopedic Surgery

## 2021-09-19 DIAGNOSIS — G5622 Lesion of ulnar nerve, left upper limb: Secondary | ICD-10-CM | POA: Diagnosis present

## 2021-09-19 HISTORY — PX: ULNAR NERVE TRANSPOSITION: SHX2595

## 2021-09-19 LAB — POCT PREGNANCY, URINE: Preg Test, Ur: NEGATIVE

## 2021-09-19 SURGERY — ULNAR NERVE DECOMPRESSION/TRANSPOSITION
Anesthesia: General | Site: Elbow | Laterality: Left

## 2021-09-19 MED ORDER — CEFAZOLIN SODIUM-DEXTROSE 2-4 GM/100ML-% IV SOLN
INTRAVENOUS | Status: AC
  Filled 2021-09-19: qty 100

## 2021-09-19 MED ORDER — MIDAZOLAM HCL 5 MG/5ML IJ SOLN
INTRAMUSCULAR | Status: DC | PRN
  Administered 2021-09-19: 2 mg via INTRAVENOUS

## 2021-09-19 MED ORDER — FENTANYL CITRATE (PF) 100 MCG/2ML IJ SOLN
INTRAMUSCULAR | Status: AC
  Filled 2021-09-19: qty 2

## 2021-09-19 MED ORDER — PROPOFOL 10 MG/ML IV BOLUS
INTRAVENOUS | Status: DC | PRN
  Administered 2021-09-19: 150 mg via INTRAVENOUS

## 2021-09-19 MED ORDER — FENTANYL CITRATE (PF) 100 MCG/2ML IJ SOLN
INTRAMUSCULAR | Status: DC | PRN
  Administered 2021-09-19 (×2): 50 ug via INTRAVENOUS

## 2021-09-19 MED ORDER — ONDANSETRON HCL 4 MG/2ML IJ SOLN
INTRAMUSCULAR | Status: DC | PRN
  Administered 2021-09-19: 4 mg via INTRAVENOUS

## 2021-09-19 MED ORDER — CEFAZOLIN SODIUM-DEXTROSE 2-4 GM/100ML-% IV SOLN
2.0000 g | INTRAVENOUS | Status: AC
  Administered 2021-09-19: 13:00:00 2 g via INTRAVENOUS

## 2021-09-19 MED ORDER — ACETAMINOPHEN 160 MG/5ML PO SOLN: 325.0000 mg | ORAL | Status: DC | PRN

## 2021-09-19 MED ORDER — LACTATED RINGERS IV SOLN: INTRAVENOUS | Status: DC

## 2021-09-19 MED ORDER — ACETAMINOPHEN 325 MG PO TABS: 325.0000 mg | ORAL_TABLET | ORAL | Status: DC | PRN

## 2021-09-19 MED ORDER — LIDOCAINE 2% (20 MG/ML) 5 ML SYRINGE
INTRAMUSCULAR | Status: DC | PRN
  Administered 2021-09-19: 60 mg via INTRAVENOUS

## 2021-09-19 MED ORDER — MEPERIDINE HCL 25 MG/ML IJ SOLN: 6.2500 mg | INTRAMUSCULAR | Status: DC | PRN

## 2021-09-19 MED ORDER — ACETAMINOPHEN 10 MG/ML IV SOLN
1000.0000 mg | Freq: Once | INTRAVENOUS | Status: AC
  Administered 2021-09-19: 15:00:00 1000 mg via INTRAVENOUS

## 2021-09-19 MED ORDER — OXYCODONE HCL 5 MG PO TABS
ORAL_TABLET | ORAL | Status: AC
  Filled 2021-09-19: qty 1

## 2021-09-19 MED ORDER — ONDANSETRON HCL 4 MG/2ML IJ SOLN: 4.0000 mg | Freq: Once | INTRAMUSCULAR | Status: DC | PRN

## 2021-09-19 MED ORDER — FENTANYL CITRATE (PF) 100 MCG/2ML IJ SOLN
25.0000 ug | INTRAMUSCULAR | Status: DC | PRN
  Administered 2021-09-19: 14:00:00 50 ug via INTRAVENOUS

## 2021-09-19 MED ORDER — OXYCODONE HCL 5 MG PO TABS
5.0000 mg | ORAL_TABLET | Freq: Once | ORAL | Status: AC | PRN
  Administered 2021-09-19: 15:00:00 5 mg via ORAL

## 2021-09-19 MED ORDER — 0.9 % SODIUM CHLORIDE (POUR BTL) OPTIME
TOPICAL | Status: DC | PRN
  Administered 2021-09-19: 14:00:00 60 mL

## 2021-09-19 MED ORDER — OXYCODONE HCL 5 MG/5ML PO SOLN: 5.0000 mg | Freq: Once | ORAL | Status: AC | PRN

## 2021-09-19 MED ORDER — MIDAZOLAM HCL 2 MG/2ML IJ SOLN
INTRAMUSCULAR | Status: AC
  Filled 2021-09-19: qty 2

## 2021-09-19 MED ORDER — HYDROCODONE-ACETAMINOPHEN 5-325 MG PO TABS: ORAL_TABLET | ORAL | 0 refills | Status: AC

## 2021-09-19 MED ORDER — BUPIVACAINE HCL (PF) 0.25 % IJ SOLN
INTRAMUSCULAR | Status: DC | PRN
  Administered 2021-09-19: 9 mL

## 2021-09-19 MED ORDER — ACETAMINOPHEN 10 MG/ML IV SOLN
INTRAVENOUS | Status: AC
  Filled 2021-09-19: qty 100

## 2021-09-19 MED ORDER — LIDOCAINE 2% (20 MG/ML) 5 ML SYRINGE
INTRAMUSCULAR | Status: AC
  Filled 2021-09-19: qty 5

## 2021-09-19 MED ORDER — DEXAMETHASONE SODIUM PHOSPHATE 10 MG/ML IJ SOLN
INTRAMUSCULAR | Status: DC | PRN
  Administered 2021-09-19: 5 mg via INTRAVENOUS

## 2021-09-19 MED ORDER — PROPOFOL 10 MG/ML IV BOLUS
INTRAVENOUS | Status: AC
  Filled 2021-09-19: qty 20

## 2021-09-19 SURGICAL SUPPLY — 46 items
APL PRP STRL LF DISP 70% ISPRP (MISCELLANEOUS) ×1
BLADE SURG 15 STRL LF DISP TIS (BLADE) ×2 IMPLANT
BLADE SURG 15 STRL SS (BLADE) ×4
BNDG CMPR 9X4 STRL LF SNTH (GAUZE/BANDAGES/DRESSINGS) ×1
BNDG ELASTIC 3X5.8 VLCR STR LF (GAUZE/BANDAGES/DRESSINGS) ×4 IMPLANT
BNDG ELASTIC 4X5.8 VLCR STR LF (GAUZE/BANDAGES/DRESSINGS) ×2 IMPLANT
BNDG ESMARK 4X9 LF (GAUZE/BANDAGES/DRESSINGS) ×2 IMPLANT
BNDG GAUZE ELAST 4 BULKY (GAUZE/BANDAGES/DRESSINGS) ×2 IMPLANT
CHLORAPREP W/TINT 26 (MISCELLANEOUS) ×2 IMPLANT
CORD BIPOLAR FORCEPS 12FT (ELECTRODE) ×2 IMPLANT
COVER BACK TABLE 60X90IN (DRAPES) ×2 IMPLANT
COVER MAYO STAND STRL (DRAPES) ×2 IMPLANT
DRAPE EXTREMITY T 121X128X90 (DISPOSABLE) ×2 IMPLANT
DRAPE SURG 17X23 STRL (DRAPES) ×2 IMPLANT
DRSG PAD ABDOMINAL 8X10 ST (GAUZE/BANDAGES/DRESSINGS) ×2 IMPLANT
GAUZE 4X4 16PLY ~~LOC~~+RFID DBL (SPONGE) ×1 IMPLANT
GAUZE SPONGE 4X4 12PLY STRL (GAUZE/BANDAGES/DRESSINGS) ×2 IMPLANT
GAUZE XEROFORM 1X8 LF (GAUZE/BANDAGES/DRESSINGS) ×2 IMPLANT
GLOVE SRG 8 PF TXTR STRL LF DI (GLOVE) ×1 IMPLANT
GLOVE SURG ENC MOIS LTX SZ7.5 (GLOVE) ×2 IMPLANT
GLOVE SURG ORTHO LTX SZ8 (GLOVE) ×2 IMPLANT
GLOVE SURG UNDER POLY LF SZ8 (GLOVE) ×2
GLOVE SURG UNDER POLY LF SZ8.5 (GLOVE) ×2 IMPLANT
GOWN STRL REUS W/ TWL LRG LVL3 (GOWN DISPOSABLE) ×1 IMPLANT
GOWN STRL REUS W/TWL LRG LVL3 (GOWN DISPOSABLE) ×2
GOWN STRL REUS W/TWL XL LVL3 (GOWN DISPOSABLE) ×4 IMPLANT
NDL HYPO 25X1 1.5 SAFETY (NEEDLE) IMPLANT
NEEDLE HYPO 25X1 1.5 SAFETY (NEEDLE) ×2 IMPLANT
NS IRRIG 1000ML POUR BTL (IV SOLUTION) ×2 IMPLANT
PACK BASIN DAY SURGERY FS (CUSTOM PROCEDURE TRAY) ×2 IMPLANT
PAD CAST 3X4 CTTN HI CHSV (CAST SUPPLIES) ×1 IMPLANT
PAD CAST 4YDX4 CTTN HI CHSV (CAST SUPPLIES) ×1 IMPLANT
PADDING CAST ABS 4INX4YD NS (CAST SUPPLIES) ×1
PADDING CAST ABS COTTON 4X4 ST (CAST SUPPLIES) ×1 IMPLANT
PADDING CAST COTTON 3X4 STRL (CAST SUPPLIES) ×2
PADDING CAST COTTON 4X4 STRL (CAST SUPPLIES) ×2
SLEEVE SCD COMPRESS KNEE MED (STOCKING) ×2 IMPLANT
STOCKINETTE 4X48 STRL (DRAPES) ×2 IMPLANT
SUT ETHILON 4 0 PS 2 18 (SUTURE) ×2 IMPLANT
SUT VIC AB 2-0 SH 27 (SUTURE) ×2
SUT VIC AB 2-0 SH 27XBRD (SUTURE) ×1 IMPLANT
SUT VICRYL 4-0 PS2 18IN ABS (SUTURE) ×1 IMPLANT
SYR BULB EAR ULCER 3OZ GRN STR (SYRINGE) ×2 IMPLANT
SYR CONTROL 10ML LL (SYRINGE) ×1 IMPLANT
TOWEL GREEN STERILE FF (TOWEL DISPOSABLE) ×2 IMPLANT
UNDERPAD 30X36 HEAVY ABSORB (UNDERPADS AND DIAPERS) ×2 IMPLANT

## 2021-09-19 NOTE — Anesthesia Postprocedure Evaluation (Signed)
Anesthesia Post Note  Patient: Brenda Welch  Procedure(s) Performed: LEFT ULNAR NERVE DECOMPRESSION AT ELBOW (Left: Elbow)     Patient location during evaluation: PACU Anesthesia Type: General Level of consciousness: awake and alert Pain management: pain level controlled Vital Signs Assessment: post-procedure vital signs reviewed and stable Respiratory status: spontaneous breathing, nonlabored ventilation, respiratory function stable and patient connected to nasal cannula oxygen Cardiovascular status: blood pressure returned to baseline and stable Postop Assessment: no apparent nausea or vomiting Anesthetic complications: no   No notable events documented.  Last Vitals:  Vitals:   09/19/21 1445 09/19/21 1500  BP: 121/74 122/85  Pulse: 60 60  Resp: 17 20  Temp:  36.8 C  SpO2: 100% 100%    Last Pain:  Vitals:   09/19/21 1524  TempSrc:   PainSc: 4                  Joyann Spidle

## 2021-09-19 NOTE — Op Note (Signed)
NAME: Brenda Welch MEDICAL RECORD NO: [id number] DATE OF BIRTH: [birth year] FACILITY: [facility] LOCATION: [facility] PHYSICIAN: Tami Ribas, MD   OPERATIVE REPORT   DATE OF PROCEDURE: 09/19/21    PREOPERATIVE DIAGNOSIS: Left ulnar neuropathy at the elbow   POSTOPERATIVE DIAGNOSIS: Left ulnar neuropathy at the elbow   PROCEDURE: Left ulnar nerve decompression at elbow   SURGEON:  Betha Loa, M.D.   ASSISTANT: Cindee Salt, MD   ANESTHESIA:  General   INTRAVENOUS FLUIDS:  Per anesthesia flow sheet.   ESTIMATED BLOOD LOSS:  Minimal.   COMPLICATIONS:  None.   SPECIMENS:  none   TOURNIQUET TIME:    Total Tourniquet Time Documented: Upper Arm (Left) - 31 minutes Total: Upper Arm (Left) - 31 minutes    DISPOSITION:  Stable to PACU.   INDICATIONS: 40 year old female with numbness and tingling left hand.  Positive nerve conduction studies.  She wishes to have left ulnar nerve decompression at the elbow.  Risks, benefits and alternatives of surgery were discussed including the risks of blood loss, infection, damage to nerves, vessels, tendons, ligaments, bone for surgery, need for additional surgery, complications with wound healing, continued pain, stiffness, , recurrence.  She voiced understanding of these risks and elected to proceed.  OPERATIVE COURSE:  After being identified preoperatively by myself,  the patient and I agreed on the procedure and site of the procedure.  The surgical site was marked.  Surgical consent had been signed. She was given IV antibiotics as preoperative antibiotic prophylaxis. She was transferred to the operating room and placed on the operating table in supine position with the Left upper extremity on an arm board.  General anesthesia was induced by the anesthesiologist.  Left upper extremity was prepped and draped in normal sterile orthopedic fashion.  A surgical pause was performed between the surgeons, anesthesia, and operating room  staff and all were in agreement as to the patient, procedure, and site of procedure.  Tourniquet at the proximal aspect of the extremity was inflated to 250 mmHg after exsanguination of the arm with an Esmarch bandage.  Incision was made at the medial side of the elbow and carried into subcutaneous tissues by spreading technique.  There is a visible anconeus epitrochlear's.  The ulnar nerve was identified proximal to this.  The nerve was carefully freed up of surrounding soft tissues coverage.  The anconeus epitrochlear's was divided.  Osborne's ligament was released decompressing the nerve.  The fascia over the FCU muscle was released and the heads of the FCU muscle separated.  The investing fascia over the nerve was then released under direct visualization.  The nerve was then decompressed proximally.  The fascia was much thinner this area.  The investing fascia over the nerve was released under direct visualization.  No remaining areas of compression were noted.  The elbow was flexed.  The nerve did not subluxate out of the groove.  Bipolar electrocautery was used to obtain hemostasis.  The wound was copiously irrigated with sterile saline.  The anterior leaflet of Osborne's ligament was repaired to the posterior subcutaneous tissues with a 2-0 Vicryl suture to provide a bolster for the nerve.  Care was taken to prevent any creation of compression.  Subcutaneous tissues were closed with inverted interrupted 4-0 Vicryl suture and the skin was closed with 4-0 nylon in a horizontal mattress fashion.  The wound was injected with quarter percent plain Marcaine to aid in postoperative analgesia.  Was dressed with sterile Xeroform 4  x 4's and ABD and wrapped with Kerlix and Ace bandage.  The tourniquet was deflated at 31 minutes.  Fingertips were pink with brisk capillary refill after deflation of tourniquet.  The operative  drapes were broken down.  The patient was awoken from anesthesia safely.  She was transferred  back to the stretcher and taken to PACU in stable condition.  I will see her back in the office in 1 week for postoperative followup.  I will give her a prescription for Norco 5/325 1-2 tabs PO q6 hours prn pain, dispense # 20.   Betha Loa, MD Electronically signed, 09/19/21

## 2021-09-19 NOTE — Transfer of Care (Signed)
Immediate Anesthesia Transfer of Care Note  Patient: Brenda Welch  Procedure(s) Performed: LEFT ULNAR NERVE DECOMPRESSION AT ELBOW (Left: Elbow)  Patient Location: PACU  Anesthesia Type:General  Level of Consciousness: sedated  Airway & Oxygen Therapy: Patient Spontanous Breathing and Patient connected to face mask oxygen  Post-op Assessment: Report given to RN and Post -op Vital signs reviewed and stable  Post vital signs: Reviewed and stable  Last Vitals:  Vitals Value Taken Time  BP 133/90 09/19/21 1405  Temp    Pulse 76 09/19/21 1407  Resp    SpO2 100 % 09/19/21 1407  Vitals shown include unvalidated device data.  Last Pain:  Vitals:   09/19/21 1145  TempSrc: Oral  PainSc: 0-No pain         Complications: No notable events documented.

## 2021-09-19 NOTE — H&P (Signed)
Brenda Welch is an 40 y.o. female.   Chief Complaint: ulnar neuropathy HPI: 40 yo female with numbness and tingling.  Positive nerve conduction studies.  She wishes to have left ulnar nerve decompression with possible transposition.  Allergies: No Known Allergies  Past Medical History:  Diagnosis Date   Hashimoto's disease    Hypothyroidism     Past Surgical History:  Procedure Laterality Date   CESAREAN SECTION     CESAREAN SECTION N/A 09/26/2013   Procedure: REPEAT CESAREAN SECTION;  Surgeon: Freddrick March. Tenny Craw, MD;  Location: [facility];  Service: Obstetrics;  Laterality: N/A;   CESAREAN SECTION N/A 04/08/2020   Procedure: CESAREAN SECTION;  Surgeon: Rande Brunt, MD;  Location: [facility];  Service: Obstetrics;  Laterality: N/A;   HERNIA REPAIR     UMBILICAL HERNIA REPAIR      Family History: Family History  Problem Relation Age of Onset   Hypertension Mother    Atrial fibrillation Mother    Diabetes Father    Hypertension Father     Social History:   reports that she has never smoked. She has never used smokeless tobacco. She reports that she does not drink alcohol and does not use drugs.  Medications: Medications Prior to Admission  Medication Sig Dispense Refill   levothyroxine (SYNTHROID) 100 MCG tablet Take 100 mcg by mouth daily before breakfast.      Results for orders placed or performed during the hospital encounter of 09/19/21 (from the past 48 hour(s))  Pregnancy, urine POC     Status: None   Collection Time: 09/19/21 11:32 AM  Result Value Ref Range   Preg Test, Ur NEGATIVE NEGATIVE    Comment:        THE SENSITIVITY OF THIS METHODOLOGY IS >24 mIU/mL     No results found.    Blood pressure 117/78, pulse 77, temperature 98.5 F (36.9 C), temperature source Oral, resp. rate 18, height 5\' 1"  (1.549 m), weight 72.3 kg, last menstrual period 09/04/2021, SpO2 100 %, not currently breastfeeding.  General appearance: alert, cooperative, and  appears stated age Head: Normocephalic, without obvious abnormality, atraumatic Neck: supple, symmetrical, trachea midline Cardio: regular rate and rhythm Resp: clear to auscultation bilaterally Extremities: Intact sensation and capillary refill all digits.  +epl/fpl/io.  No wounds.  Pulses: 2+ and symmetric Skin: Skin color, texture, turgor normal. No rashes or lesions Neurologic: Grossly normal Incision/Wound: none  Assessment/Plan Left ulnar neuropathy.  Non operative and operative treatment options have been discussed with the patient and patient wishes to proceed with operative treatment. Risks, benefits, and alternatives of surgery have been discussed and the patient agrees with the plan of care.   09/06/2021 09/19/2021, 12:10 PM

## 2021-09-19 NOTE — Op Note (Signed)
I assisted Surgeon(s) and Role:    * Betha Loa, MD - Primary    Cindee Salt, MD - Assisting on the Procedure(s): LEFT ULNAR NERVE DECOMPRESSION AT ELBOW on 09/19/2021.  I provided assistance on this case as follows: Set up, approach, and defecation of the ulnar nerve, positioning of the arm, retraction for decompression of the nerve proximally and distally, closure of the wound and application of the dressing  Electronically signed by: Cindee Salt, MD Date: 09/19/2021 Time: 2:04 PM

## 2021-09-19 NOTE — Anesthesia Preprocedure Evaluation (Addendum)
Anesthesia Evaluation  Patient identified by MRN, date of birth, ID band Patient awake    Reviewed: Allergy & Precautions, NPO status , Patient's Chart, lab work & pertinent test results  Airway Mallampati: I  TM Distance: >3 FB Neck ROM: Full    Dental no notable dental hx. (+) Teeth Intact, Dental Advisory Given, Partial Upper   Pulmonary neg pulmonary ROS,    Pulmonary exam normal breath sounds clear to auscultation       Cardiovascular negative cardio ROS Normal cardiovascular exam Rhythm:Regular Rate:Normal     Neuro/Psych negative neurological ROS  negative psych ROS   GI/Hepatic negative GI ROS, Neg liver ROS,   Endo/Other  GestationalHypothyroidism   Renal/GU negative Renal ROS  negative genitourinary   Musculoskeletal negative musculoskeletal ROS (+)   Abdominal   Peds negative pediatric ROS (+)  Hematology negative hematology ROS (+)   Anesthesia Other Findings   Reproductive/Obstetrics negative OB ROS                            Anesthesia Physical Anesthesia Plan  ASA: 2  Anesthesia Plan: General   Post-op Pain Management:    Induction: Intravenous  PONV Risk Score and Plan: 3 and Ondansetron and Dexamethasone  Airway Management Planned: LMA  Additional Equipment: None  Intra-op Plan:   Post-operative Plan:   Informed Consent:   Plan Discussed with: CRNA, Surgeon and Anesthesiologist  Anesthesia Plan Comments: ( )       Anesthesia Quick Evaluation

## 2021-09-19 NOTE — Anesthesia Procedure Notes (Signed)
Procedure Name: LMA Insertion Date/Time: 09/19/2021 1:18 PM Performed by: Burna Cash, CRNA Pre-anesthesia Checklist: Patient identified, Emergency Drugs available, Suction available and Patient being monitored Patient Re-evaluated:Patient Re-evaluated prior to induction Oxygen Delivery Method: Circle system utilized Preoxygenation: Pre-oxygenation with 100% oxygen Induction Type: IV induction Ventilation: Mask ventilation without difficulty LMA: LMA inserted LMA Size: 4.0 Number of attempts: 1 Airway Equipment and Method: Bite block Placement Confirmation: positive ETCO2 Tube secured with: Tape Dental Injury: Teeth and Oropharynx as per pre-operative assessment

## 2021-09-19 NOTE — Discharge Instructions (Addendum)
[facility] Instructions Hand Surgery  Wound Care: Keep your hand elevated above the level of your heart.  Do not allow it to dangle by your side.  Keep the dressing dry and do not remove it unless your doctor advises you to do so.  He will usually change it at the time of your post-op visit.  Moving your fingers is advised to stimulate circulation but will depend on the site of your surgery.  If you have a splint applied, your doctor will advise you regarding movement.  Activity: Do not drive or operate machinery today.  Rest today and then you may return to your normal activity and work as indicated by your physician.  Diet:  Drink liquids today or eat a light diet.  You may resume a regular diet tomorrow.    General expectations: Pain for two to three days. Fingers may become slightly swollen.  Call your doctor if any of the following occur: Severe pain not relieved by pain medication. Elevated temperature. Dressing soaked with blood. Inability to move fingers. White or bluish color to fingers.    Post Anesthesia Home Care Instructions  Activity: Get plenty of rest for the remainder of the day. A responsible individual must stay with you for 24 hours following the procedure.  For the next 24 hours, DO NOT: -Drive a car -Advertising copywriter -Drink alcoholic beverages -Take any medication unless instructed by your physician -Make any legal decisions or sign important papers.  Meals: Start with liquid foods such as gelatin or soup. Progress to regular foods as tolerated. Avoid greasy, spicy, heavy foods. If nausea and/or vomiting occur, drink only clear liquids until the nausea and/or vomiting subsides. Call your physician if vomiting continues.  Special Instructions/Symptoms: Your throat may feel dry or sore from the anesthesia or the breathing tube placed in your throat during surgery. If this causes discomfort, gargle with warm salt water. The discomfort should disappear  within 24 hours.  If you had a scopolamine patch placed behind your ear for the management of post- operative nausea and/or vomiting:  1. The medication in the patch is effective for 72 hours, after which it should be removed.  Wrap patch in a tissue and discard in the trash. Wash hands thoroughly with soap and water. 2. You may remove the patch earlier than 72 hours if you experience unpleasant side effects which may include dry mouth, dizziness or visual disturbances. 3. Avoid touching the patch. Wash your hands with soap and water after contact with the patch.  No tylenol until after 8:30 tonight if needed.  This includes your pain medication if it has tylenol in it.  You had oxycodone today at 3:00pm.  Do not take pain medication until after 9pm.

## 2021-09-24 ENCOUNTER — Encounter (HOSPITAL_BASED_OUTPATIENT_CLINIC_OR_DEPARTMENT_OTHER): Payer: Self-pay | Admitting: Orthopedic Surgery

## 2022-08-10 ENCOUNTER — Other Ambulatory Visit: Payer: Self-pay | Admitting: Home Modifications

## 2022-08-10 DIAGNOSIS — E042 Nontoxic multinodular goiter: Secondary | ICD-10-CM

## 2022-08-17 ENCOUNTER — Ambulatory Visit (INDEPENDENT_AMBULATORY_CARE_PROVIDER_SITE_OTHER): Payer: 59

## 2022-08-17 ENCOUNTER — Ambulatory Visit
Admission: EM | Admit: 2022-08-17 | Discharge: 2022-08-17 | Disposition: A | Discharge status: Home / Self Care | Payer: 59

## 2022-08-17 ENCOUNTER — Ambulatory Visit: Admit: 2022-08-17 | Payer: 59

## 2022-08-17 DIAGNOSIS — R059 Cough, unspecified: Secondary | ICD-10-CM | POA: Diagnosis not present

## 2022-08-17 MED ORDER — DOXYCYCLINE HYCLATE 100 MG PO CAPS: 100.0000 mg | ORAL_CAPSULE | Freq: Two times a day (BID) | ORAL | 0 refills | Status: AC

## 2022-08-17 MED ORDER — BENZONATATE 200 MG PO CAPS: 200.0000 mg | ORAL_CAPSULE | Freq: Three times a day (TID) | ORAL | 0 refills | Status: AC | PRN

## 2022-08-17 NOTE — Discharge Instructions (Addendum)
Advised patient to take medications as directed with food to completion.  Advised may use Tessalon Perles daily or as needed for cough.  Encouraged patient increase daily water intake to 64 ounces per day while taking these medications.

## 2022-08-17 NOTE — ED Provider Notes (Signed)
[facility]    CSN: [id number] Arrival date & time: 08/17/22  1712      History   Chief Complaint Chief Complaint  Patient presents with   Cough    HPI Brenda Welch is a 41 y.o. female.   HPI 41 year old female presents with cough for 2 weeks recent pain in chest when she takes deep breaths.  PMH significant for hypothyroidism.  Past Medical History:  Diagnosis Date   Hashimoto's disease    Hypothyroidism     Patient Active Problem List   Diagnosis Date Noted   Status post C-section 04/09/2020   Abnormal glucose tolerance test (GTT) during pregnancy, antepartum 01/18/2020   Cesarean delivery delivered 09/26/2013    Past Surgical History:  Procedure Laterality Date   CESAREAN SECTION     CESAREAN SECTION N/A 09/26/2013   Procedure: REPEAT CESAREAN SECTION;  Surgeon: Freddrick March. Tenny Craw, MD;  Location: [facility];  Service: Obstetrics;  Laterality: N/A;   CESAREAN SECTION N/A 04/08/2020   Procedure: CESAREAN SECTION;  Surgeon: Rande Brunt, MD;  Location: [facility];  Service: Obstetrics;  Laterality: N/A;   HERNIA REPAIR     ULNAR NERVE TRANSPOSITION Left 09/19/2021   Procedure: LEFT ULNAR NERVE DECOMPRESSION AT ELBOW;  Surgeon: Betha Loa, MD;  Location: [facility];  Service: Orthopedics;  Laterality: Left;   UMBILICAL HERNIA REPAIR      OB History     Gravida  7   Para  3   Term  3   Preterm      AB  4   Living  3      SAB  2   IAB  2   Ectopic      Multiple  0   Live Births  3            Home Medications    Prior to Admission medications   Medication Sig Start Date End Date Taking? Authorizing Provider  albuterol (VENTOLIN HFA) 108 (90 Base) MCG/ACT inhaler SMARTSIG:2 Puff(s) By Mouth Every 4 Hours PRN 08/04/22  Yes [provider]  levonorgestrel (MIRENA, 52 MG,) 20 MCG/DAY IUD Take 1 device by intrauterine route. 06/10/20  Yes [provider]  benzonatate (TESSALON) 200 MG capsule  Take 1 capsule (200 mg total) by mouth 3 (three) times daily as needed for up to 7 days. 08/17/22 08/24/22 Yes Trevor Iha, FNP  doxycycline (VIBRAMYCIN) 100 MG capsule Take 1 capsule (100 mg total) by mouth 2 (two) times daily for 7 days. 08/17/22 08/24/22 Yes Trevor Iha, FNP  HYDROcodone-acetaminophen [facility]) 5-325 MG tablet 1-2 tabs po q6 hours prn pain 09/19/21   Betha Loa, MD  levothyroxine (SYNTHROID) 100 MCG tablet Take 100 mcg by mouth daily before breakfast.    [provider]    Family History Family History  Problem Relation Age of Onset   Hypertension Mother    Atrial fibrillation Mother    Diabetes Father    Hypertension Father     Social History Social History   Tobacco Use   Smoking status: Never   Smokeless tobacco: Never  Vaping Use   Vaping Use: Never used  Substance Use Topics   Alcohol use: No   Drug use: No     Allergies   Patient has no known allergies.   Review of Systems Review of Systems  Respiratory:  Positive for cough.   All other systems reviewed and are negative.    Physical Exam Triage Vital Signs  ED Triage Vitals  Enc Vitals Group     BP 08/17/22 1822 131/84     Pulse Rate 08/17/22 1822 76     Resp 08/17/22 1822 20     Temp 08/17/22 1822 99 F (37.2 C)     Temp Source 08/17/22 1822 Oral     SpO2 08/17/22 1822 98 %     Weight 08/17/22 1817 164 lb (74.4 kg)     Height 08/17/22 1817 5\' 1"  (1.549 m)     Head Circumference --      Peak Flow --      Pain Score 08/17/22 1816 2     Pain Loc --      Pain Edu? --      Excl. in GC? --    No data found.  Updated Vital Signs BP 131/84 (BP Location: Left Arm)   Pulse 76   Temp 99 F (37.2 C) (Oral)   Resp 20   Ht 5\' 1"  (1.549 m)   Wt 164 lb (74.4 kg)   LMP 07/31/2022   SpO2 98%   BMI 30.99 kg/m    Physical Exam Vitals and nursing note reviewed.  Constitutional:      Appearance: Normal appearance. She is normal weight.  HENT:     Head: Normocephalic and  atraumatic.     Right Ear: Tympanic membrane, ear canal and external ear normal.     Left Ear: Tympanic membrane, ear canal and external ear normal.     Mouth/Throat:     Mouth: Mucous membranes are moist.     Pharynx: Oropharynx is clear.  Eyes:     Extraocular Movements: Extraocular movements intact.     Conjunctiva/sclera: Conjunctivae normal.     Pupils: Pupils are equal, round, and reactive to light.  Cardiovascular:     Rate and Rhythm: Normal rate and regular rhythm.     Pulses: Normal pulses.     Heart sounds: Normal heart sounds.  Pulmonary:     Effort: Pulmonary effort is normal.     Breath sounds: Normal breath sounds. No wheezing, rhonchi or rales.     Comments: Frequent nonproductive cough noted on exam Musculoskeletal:        General: Normal range of motion.     Cervical back: Normal range of motion and neck supple.  Skin:    General: Skin is warm and dry.  Neurological:     General: No focal deficit present.     Mental Status: She is alert and oriented to person, place, and time.      UC Treatments / Results  Labs (all labs ordered are listed, but only abnormal results are displayed) Labs Reviewed - No data to display  EKG   Radiology DG Chest 2 View  Result Date: 08/17/2022 CLINICAL DATA:  Cough x 2 weeks EXAM: CHEST - 2 VIEW COMPARISON:  Chest x-ray 08/04/2006 FINDINGS: The heart and mediastinal contours are within normal limits. No focal consolidation. No pulmonary edema. No pleural effusion. No pneumothorax. No acute osseous abnormality. IMPRESSION: No active cardiopulmonary disease. Electronically Signed   By: 08/19/2022 M.D.   On: 08/17/2022 19:04    Procedures Procedures (including critical care time)  Medications Ordered in UC Medications - No data to display  Initial Impression / Assessment and Plan / UC Course  I have reviewed the triage vital signs and the nursing notes.  Pertinent labs & imaging results that were available during my  care of the patient were reviewed  by me and considered in my medical decision making (see chart for details).     MDM: 1.  Cough-CXR revealed above.  Rx'd doxycycline and Tessalon. Advised patient to take medications as directed with food to completion.  Advised may use Tessalon Perles daily or as needed for cough.  Encouraged patient increase daily water intake to 64 ounces per day while taking these medications.  Final Clinical Impressions(s) / UC Diagnoses   Final diagnoses:  Cough, unspecified type     Discharge Instructions      Advised patient to take medications as directed with food to completion.  Advised may use Tessalon Perles daily or as needed for cough.  Encouraged patient increase daily water intake to 64 ounces per day while taking these medications.     ED Prescriptions     Medication Sig Dispense Auth. Provider   doxycycline (VIBRAMYCIN) 100 MG capsule Take 1 capsule (100 mg total) by mouth 2 (two) times daily for 7 days. 14 capsule Trevor Iha, FNP   benzonatate (TESSALON) 200 MG capsule Take 1 capsule (200 mg total) by mouth 3 (three) times daily as needed for up to 7 days. 40 capsule Trevor Iha, FNP      PDMP not reviewed this encounter.   Trevor Iha, FNP 08/17/22 1925

## 2022-08-17 NOTE — ED Triage Notes (Signed)
Pt presents to Urgent Care with c/o cough x 2 weeks and recent pain in her chest w/ deep breaths.

## 2022-08-18 ENCOUNTER — Ambulatory Visit: Payer: 59

## 2022-08-24 ENCOUNTER — Ambulatory Visit
Admission: RE | Admit: 2022-08-24 | Discharge: 2022-08-24 | Disposition: A | Discharge status: Home / Self Care | Payer: 59 | Source: Ambulatory Visit | Attending: Home Modifications | Admitting: Home Modifications

## 2022-08-24 DIAGNOSIS — E042 Nontoxic multinodular goiter: Secondary | ICD-10-CM

## 2022-09-02 ENCOUNTER — Ambulatory Visit: Payer: 59

## 2022-09-03 ENCOUNTER — Ambulatory Visit
Admission: RE | Admit: 2022-09-03 | Discharge: 2022-09-03 | Disposition: A | Discharge status: Home / Self Care | Payer: 59 | Source: Ambulatory Visit | Attending: Family Medicine | Admitting: Family Medicine

## 2022-09-03 VITALS — BP 123/84 | HR 84 | Temp 98.3°F | Resp 20

## 2022-09-03 DIAGNOSIS — J069 Acute upper respiratory infection, unspecified: Secondary | ICD-10-CM | POA: Diagnosis not present

## 2022-09-03 LAB — POCT INFLUENZA A/B
Influenza A, POC: NEGATIVE
Influenza B, POC: NEGATIVE

## 2022-09-03 LAB — POC SARS CORONAVIRUS 2 AG -  ED: SARS Coronavirus 2 Ag: NEGATIVE

## 2022-09-03 MED ORDER — PREDNISONE 20 MG PO TABS: 40.0000 mg | ORAL_TABLET | Freq: Every day | ORAL | 0 refills | Status: AC

## 2022-09-03 NOTE — ED Triage Notes (Signed)
Pt c/o cough, congestion w/ facial pain and nausea since Tues. Denies fever. Did have some vomiting yesterday but has since resolved. Denies fever. Taking tylenol and sudafed PE prn. Hx of sinus infections.

## 2022-09-03 NOTE — Discharge Instructions (Signed)
Drink lots of fluids Take the prednisone daily for 5 days May use over-the-counter cough and cold medicines as needed See your doctor if not improving by next week

## 2023-01-25 DIAGNOSIS — E039 Hypothyroidism, unspecified: Secondary | ICD-10-CM | POA: Diagnosis not present

## 2023-01-25 DIAGNOSIS — Z87898 Personal history of other specified conditions: Secondary | ICD-10-CM | POA: Diagnosis not present

## 2023-02-01 DIAGNOSIS — R7309 Other abnormal glucose: Secondary | ICD-10-CM | POA: Diagnosis not present

## 2023-02-01 DIAGNOSIS — E039 Hypothyroidism, unspecified: Secondary | ICD-10-CM | POA: Diagnosis not present

## 2023-03-15 DIAGNOSIS — E039 Hypothyroidism, unspecified: Secondary | ICD-10-CM | POA: Diagnosis not present

## 2023-04-19 DIAGNOSIS — E039 Hypothyroidism, unspecified: Secondary | ICD-10-CM | POA: Diagnosis not present

## 2023-06-03 DIAGNOSIS — E039 Hypothyroidism, unspecified: Secondary | ICD-10-CM | POA: Diagnosis not present

## 2023-06-10 DIAGNOSIS — R7309 Other abnormal glucose: Secondary | ICD-10-CM | POA: Diagnosis not present

## 2023-06-10 DIAGNOSIS — E039 Hypothyroidism, unspecified: Secondary | ICD-10-CM | POA: Diagnosis not present

## 2023-08-05 DIAGNOSIS — Z01419 Encounter for gynecological examination (general) (routine) without abnormal findings: Secondary | ICD-10-CM | POA: Diagnosis not present

## 2023-08-05 DIAGNOSIS — Z1231 Encounter for screening mammogram for malignant neoplasm of breast: Secondary | ICD-10-CM | POA: Diagnosis not present

## 2023-10-26 DIAGNOSIS — Z0000 Encounter for general adult medical examination without abnormal findings: Secondary | ICD-10-CM | POA: Diagnosis not present

## 2023-10-26 DIAGNOSIS — Z131 Encounter for screening for diabetes mellitus: Secondary | ICD-10-CM | POA: Diagnosis not present

## 2023-10-26 DIAGNOSIS — E039 Hypothyroidism, unspecified: Secondary | ICD-10-CM | POA: Diagnosis not present

## 2023-10-26 DIAGNOSIS — Z13220 Encounter for screening for lipoid disorders: Secondary | ICD-10-CM | POA: Diagnosis not present

## 2023-12-21 DIAGNOSIS — E039 Hypothyroidism, unspecified: Secondary | ICD-10-CM | POA: Diagnosis not present

## 2023-12-28 DIAGNOSIS — E039 Hypothyroidism, unspecified: Secondary | ICD-10-CM | POA: Diagnosis not present

## 2023-12-28 DIAGNOSIS — Z8632 Personal history of gestational diabetes: Secondary | ICD-10-CM | POA: Diagnosis not present
